# Patient Record
Sex: Female | Born: 2000 | Race: White | Hispanic: No | Marital: Single | Smoking: Never smoker
Health system: Southern US, Community
[De-identification: ages and names within clinical notes are randomized; demographics above are authoritative.]

## PROBLEM LIST (undated history)

## (undated) DIAGNOSIS — G919 Hydrocephalus, unspecified: Secondary | ICD-10-CM

## (undated) DIAGNOSIS — R569 Unspecified convulsions: Secondary | ICD-10-CM

## (undated) DIAGNOSIS — F419 Anxiety disorder, unspecified: Secondary | ICD-10-CM

## (undated) HISTORY — PX: CSF SHUNT: SHX92

## (undated) HISTORY — PX: BRAIN SURGERY: SHX531

## (undated) HISTORY — DX: Anxiety disorder, unspecified: F41.9

---
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
## 2021-12-01 ENCOUNTER — Emergency Department (HOSPITAL_COMMUNITY): Payer: Medicaid Other

## 2021-12-01 ENCOUNTER — Emergency Department (HOSPITAL_COMMUNITY)
Admission: EM | Admit: 2021-12-01 | Discharge: 2021-12-02 | Disposition: A | Discharge status: Home / Self Care | Payer: Medicaid Other | Attending: Emergency Medicine | Admitting: Emergency Medicine

## 2021-12-01 DIAGNOSIS — R569 Unspecified convulsions: Secondary | ICD-10-CM | POA: Diagnosis not present

## 2021-12-01 LAB — CBG MONITORING, ED: Glucose-Capillary: 148 mg/dL — ABNORMAL HIGH (ref 70–99)

## 2021-12-01 LAB — I-STAT BETA HCG BLOOD, ED (MC, WL, AP ONLY): I-stat hCG, quantitative: <5 m[IU]/mL (ref <5)

## 2021-12-01 MED ORDER — SODIUM CHLORIDE 0.9 % IV BOLUS
1000.0000 mL | Freq: Once | INTRAVENOUS | Status: AC
Start: 2021-12-01 — End: 2021-12-02
  Administered 2021-12-01: 1000 mL via INTRAVENOUS

## 2021-12-01 MED ORDER — SODIUM CHLORIDE 0.9 % IV SOLN: INTRAVENOUS | Status: DC

## 2021-12-01 MED ORDER — IBUPROFEN 400 MG PO TABS
600.0000 mg | ORAL_TABLET | Freq: Once | ORAL | Status: AC
  Administered 2021-12-01: 600 mg via ORAL
  Filled 2021-12-01: qty 1

## 2021-12-01 NOTE — ED Triage Notes (Signed)
Patient BIB by EMS for witnessed seizure . Patient has h/x of seizures and hydrocephalus . Patient has a shunt placement for condition and takes keppra daily for seizures. Airway is clear , respirations are even and non labored . Patient cannot remember her seizure , but denies falls.  ?

## 2021-12-01 NOTE — ED Provider Notes (Signed)
?[facility] ?Provider Note ? ? ?CSN: [id number] ?Arrival date & time: 12/01/21  2152 ? ?  ? ?History ? ?Chief Complaint  ?Patient presents with  ? Witnessed Seizure   ? ? ?Sonya Miller is a 21 y.o. female. ? ?Pt is a 21 yo with a pmhx of seizures and hydrocephalus.  Pt takes keppra 1000 mg bid.  The pt said she's been compliant with her meds.  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  She said she went to the bathroom and had a seizure while there.  She does not hurt anywhere other than her head.  She said her roommates heard her and found her in the bathroom.  Pt does not remember the seizure.  She did not bite her tongue.  No incontinence.  No f/c.  She said she has been sleeping. ? ? ?  ? ?Home Medications ?Prior to Admission medications   ?Not on File  ?   ? ?Allergies    ?Patient has no allergy information on record.   ? ?Review of Systems   ?Review of Systems  ?Neurological:  Positive for seizures.  ?All other systems reviewed and are negative. ? ?Physical Exam ?Updated Vital Signs ?BP 100/63   Pulse 74   Temp 98.2 ?F (36.8 ?C) (Oral)   Resp 19   Ht 5\' 2"  (1.575 m)   Wt 66.7 kg   SpO2 99%   BMI 26.89 kg/m?  ?Physical Exam ?Vitals and nursing note reviewed.  ?Constitutional:   ?   Appearance: Normal appearance.  ?HENT:  ?   Head: Normocephalic and atraumatic.  ?   Right Ear: External ear normal.  ?   Left Ear: External ear normal.  ?   Nose: Nose normal.  ?   Mouth/Throat:  ?   Mouth: Mucous membranes are moist.  ?   Pharynx: Oropharynx is clear.  ?Eyes:  ?   Conjunctiva/sclera: Conjunctivae normal.  ?   Pupils: Pupils are equal, round, and reactive to light.  ?Cardiovascular:  ?   Rate and Rhythm: Normal rate and regular rhythm.  ?   Pulses: Normal pulses.  ?   Heart sounds: Normal heart sounds.  ?Pulmonary:  ?   Effort: Pulmonary effort is normal.  ?   Breath sounds: Normal breath sounds.  ?Abdominal:  ?   General: Abdomen is flat.  Bowel sounds are normal.  ?   Palpations: Abdomen is soft.  ?Musculoskeletal:     ?   General: Normal range of motion.  ?   Cervical back: Normal range of motion and neck supple.  ?Skin: ?   General: Skin is warm.  ?   Capillary Refill: Capillary refill takes less than 2 seconds.  ?Neurological:  ?   General: No focal deficit present.  ?   Mental Status: She is alert and oriented to person, place, and time.  ?Psychiatric:     ?   Mood and Affect: Mood normal.     ?   Behavior: Behavior normal.     ?   Thought Content: Thought content normal.     ?   Judgment: Judgment normal.  ? ? ?ED Results / Procedures / Treatments   ?Labs ?(all labs ordered are listed, but only abnormal results are displayed) ?Labs Reviewed  ?CBG MONITORING, ED - Abnormal; Notable for the following components:  ?    Result Value  ? Glucose-Capillary 148 (*)   ?  All other components within normal limits  ?CBC WITH DIFFERENTIAL/PLATELET  ?URINALYSIS, ROUTINE W REFLEX MICROSCOPIC  ?CBC WITH DIFFERENTIAL/PLATELET  ?COMPREHENSIVE METABOLIC PANEL  ?MAGNESIUM  ?CBC WITH DIFFERENTIAL/PLATELET  ?I-STAT BETA HCG BLOOD, ED ([facility], [facility], [facility] ONLY)  ? ? ?EKG ?None ? ?Radiology ?CT Head Wo Contrast ? ?Result Date: 12/01/2021 ?CLINICAL DATA:  Sudden onset severe headache, seizure EXAM: CT HEAD WITHOUT CONTRAST TECHNIQUE: Contiguous axial images were obtained from the base of the skull through the vertex without intravenous contrast. RADIATION DOSE REDUCTION: This exam was performed according to the departmental dose-optimization program which includes automated exposure control, adjustment of the mA and/or kV according to patient size and/or use of iterative reconstruction technique. COMPARISON:  None. FINDINGS: Brain: Ventriculostomy catheter via a right parietooccipital approach, tip in the region of the left thalamus. There is complete decompression of the lateral ventricles. Congenital abnormalities within the bilateral parietooccipital regions. Partial agenesis  of the corpus callosum. No acute infarct or hemorrhage. Midline structures are otherwise unremarkable. No acute extra-axial fluid collections. No mass effect. Vascular: No hyperdense vessel or unexpected calcification. Skull: Normal. Negative for fracture or focal lesion. Sinuses/Orbits: No acute finding. Other: None. IMPRESSION: 1. 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 2. No acute intracranial process. 3. Ventriculostomy catheter via a right parietooccipital approach, with complete decompression of the ventricular system. Electronically Signed   By: Randa Ngo M.D.   On: 12/01/2021 22:50   ? ?Procedures ?Procedures  ? ? ?Medications Ordered in ED ?Medications  ?sodium chloride 0.9 % bolus 1,000 mL (1,000 mLs Intravenous New Bag/Given 12/01/21 2247)  ?  And  ?0.9 %  sodium chloride infusion (has no administration in time range)  ?ibuprofen (ADVIL) tablet 600 mg (600 mg Oral Given 12/01/21 2245)  ? ? ?ED Course/ Medical Decision Making/ A&P ?  ?                        ?Medical Decision Making ?Amount and/or Complexity of Data Reviewed ?Labs: ordered. ?Radiology: ordered. ? ?Risk ?Prescription drug management. ? ? ?This patient presents to the ED for concern of seizure, this involves an extensive number of treatment options, and is a complaint that carries with it a high risk of complications and morbidity.  The differential diagnosis includes shunt abn, electrolyte abn, uti ? ? ?Co morbidities that complicate the patient evaluation ? ?Seizure d/o, shunt ? ? ?Additional history obtained: ? ?Additional history obtained from epic chart review ?External records from outside source obtained and reviewed including EMS report ? ? ?Lab Tests: ? ?I Ordered, and personally interpreted labs.  The pertinent results include:  preg is nl ? ? ?Imaging Studies ordered: ? ?I ordered imaging studies including ct head  ?I independently visualized  and interpreted imaging which showed  ?  ?IMPRESSION:  ?1. Congenital abnormalities of the bilateral parietal and occipital  ?lobes, as well as partial agenesis of the corpus callosum.  ?2. No acute intracranial process.  ?3. Ventriculostomy catheter via a right parietooccipital approach,  ?with complete decompression of the ventricular system.  ? ?I agree with the radiologist interpretation ? ? ?Cardiac Monitoring: ? ?The patient was maintained on a cardiac monitor.  I personally viewed and interpreted the cardiac monitored which showed an underlying rhythm of: nsr ? ? ? ?Problem List / ED Course: ? ?Seizure:  labs pending at shift change.  Pt signed out to Dr. Tyrone Nine ? ? ? ?  Social Determinants of Health: ? ?In college ? ?  ? ? ? ? ? ? ? ?Final Clinical Impression(s) / ED Diagnoses ?Final diagnoses:  ?Seizure ([place])  ? ? ?Rx / DC Orders ?ED Discharge Orders   ? ? None  ? ?  ? ? ?  ?Isla Pence, MD ?12/01/21 2324 ? ?

## 2021-12-02 LAB — CBC WITH DIFFERENTIAL/PLATELET
Abs Immature Granulocytes: 0.04 10*3/uL (ref 0.00–0.07)
Basophils Absolute: 0 10*3/uL (ref 0.0–0.1)
Basophils Relative: 0 %
Eosinophils Absolute: 0 10*3/uL (ref 0.0–0.5)
Eosinophils Relative: 0 %
HCT: 38.3 % (ref 36.0–46.0)
Hemoglobin: 12.5 g/dL (ref 12.0–15.0)
Immature Granulocytes: 0 %
Lymphocytes Relative: 17 %
Lymphs Abs: 1.8 10*3/uL (ref 0.7–4.0)
MCH: 30.4 pg (ref 26.0–34.0)
MCHC: 32.6 g/dL (ref 30.0–36.0)
MCV: 93.2 fL (ref 80.0–100.0)
Monocytes Absolute: 0.7 10*3/uL (ref 0.1–1.0)
Monocytes Relative: 7 %
Neutro Abs: 7.9 10*3/uL — ABNORMAL HIGH (ref 1.7–7.7)
Neutrophils Relative %: 76 %
Platelets: 173 10*3/uL (ref 150–400)
RBC: 4.11 MIL/uL (ref 3.87–5.11)
RDW: 12.7 % (ref 11.5–15.5)
WBC: 10.5 10*3/uL (ref 4.0–10.5)
nRBC: 0 % (ref 0.0–0.2)

## 2021-12-02 LAB — COMPREHENSIVE METABOLIC PANEL
ALT: 53 U/L — ABNORMAL HIGH (ref 0–44)
AST: 36 U/L (ref 15–41)
Albumin: 3.7 g/dL (ref 3.5–5.0)
Alkaline Phosphatase: 55 U/L (ref 38–126)
Anion gap: 10 (ref 5–15)
BUN: 13 mg/dL (ref 6–20)
CO2: 19 mmol/L — ABNORMAL LOW (ref 22–32)
Calcium: 8.3 mg/dL — ABNORMAL LOW (ref 8.9–10.3)
Chloride: 107 mmol/L (ref 98–111)
Creatinine, Ser: 0.71 mg/dL (ref 0.44–1.00)
GFR, Estimated: >60 mL/min (ref >60)
Glucose, Bld: 132 mg/dL — ABNORMAL HIGH (ref 70–99)
Potassium: 3.6 mmol/L (ref 3.5–5.1)
Sodium: 136 mmol/L (ref 135–145)
Total Bilirubin: 0.3 mg/dL (ref 0.3–1.2)
Total Protein: 6.1 g/dL — ABNORMAL LOW (ref 6.5–8.1)

## 2021-12-02 LAB — MAGNESIUM: Magnesium: 1.9 mg/dL (ref 1.7–2.4)

## 2021-12-02 NOTE — Discharge Instructions (Signed)
There was no significant abnormality of your blood work.  Please call your neurologist and let them know that you had a breakthrough seizure and see if they want to change any dosages of your medications. ?

## 2022-09-30 DIAGNOSIS — G40109 Localization-related (focal) (partial) symptomatic epilepsy and epileptic syndromes with simple partial seizures, not intractable, without status epilepticus: Secondary | ICD-10-CM | POA: Diagnosis not present

## 2022-10-20 DIAGNOSIS — L700 Acne vulgaris: Secondary | ICD-10-CM | POA: Diagnosis not present

## 2022-10-20 DIAGNOSIS — Z111 Encounter for screening for respiratory tuberculosis: Secondary | ICD-10-CM | POA: Diagnosis not present

## 2022-10-20 DIAGNOSIS — Z982 Presence of cerebrospinal fluid drainage device: Secondary | ICD-10-CM | POA: Diagnosis not present

## 2022-10-20 DIAGNOSIS — G40909 Epilepsy, unspecified, not intractable, without status epilepticus: Secondary | ICD-10-CM | POA: Diagnosis not present

## 2022-10-20 DIAGNOSIS — F419 Anxiety disorder, unspecified: Secondary | ICD-10-CM | POA: Diagnosis not present

## 2022-10-20 DIAGNOSIS — Z1389 Encounter for screening for other disorder: Secondary | ICD-10-CM | POA: Diagnosis not present

## 2022-10-20 DIAGNOSIS — F900 Attention-deficit hyperactivity disorder, predominantly inattentive type: Secondary | ICD-10-CM | POA: Diagnosis not present

## 2023-02-27 DIAGNOSIS — G40909 Epilepsy, unspecified, not intractable, without status epilepticus: Secondary | ICD-10-CM | POA: Diagnosis not present

## 2023-02-28 DIAGNOSIS — G40909 Epilepsy, unspecified, not intractable, without status epilepticus: Secondary | ICD-10-CM | POA: Diagnosis not present

## 2023-03-20 DIAGNOSIS — L700 Acne vulgaris: Secondary | ICD-10-CM | POA: Diagnosis not present

## 2023-03-20 DIAGNOSIS — R5383 Other fatigue: Secondary | ICD-10-CM | POA: Diagnosis not present

## 2023-03-20 DIAGNOSIS — K5900 Constipation, unspecified: Secondary | ICD-10-CM | POA: Diagnosis not present

## 2023-03-20 DIAGNOSIS — F900 Attention-deficit hyperactivity disorder, predominantly inattentive type: Secondary | ICD-10-CM | POA: Diagnosis not present

## 2023-03-20 DIAGNOSIS — F419 Anxiety disorder, unspecified: Secondary | ICD-10-CM | POA: Diagnosis not present

## 2023-03-20 DIAGNOSIS — G40909 Epilepsy, unspecified, not intractable, without status epilepticus: Secondary | ICD-10-CM | POA: Diagnosis not present

## 2023-03-20 DIAGNOSIS — R42 Dizziness and giddiness: Secondary | ICD-10-CM | POA: Diagnosis not present

## 2023-03-20 DIAGNOSIS — N939 Abnormal uterine and vaginal bleeding, unspecified: Secondary | ICD-10-CM | POA: Diagnosis not present

## 2023-03-24 DIAGNOSIS — R5383 Other fatigue: Secondary | ICD-10-CM | POA: Diagnosis not present

## 2023-03-24 DIAGNOSIS — G40109 Localization-related (focal) (partial) symptomatic epilepsy and epileptic syndromes with simple partial seizures, not intractable, without status epilepticus: Secondary | ICD-10-CM | POA: Diagnosis not present

## 2023-05-28 ENCOUNTER — Emergency Department (HOSPITAL_COMMUNITY)
Admission: EM | Admit: 2023-05-28 | Discharge: 2023-05-28 | Disposition: A | Discharge status: Home / Self Care | Payer: Medicaid Other | Attending: Emergency Medicine | Admitting: Emergency Medicine

## 2023-05-28 ENCOUNTER — Emergency Department (HOSPITAL_COMMUNITY): Payer: Medicaid Other

## 2023-05-28 ENCOUNTER — Other Ambulatory Visit: Payer: Self-pay

## 2023-05-28 ENCOUNTER — Encounter (HOSPITAL_COMMUNITY): Payer: Self-pay | Admitting: Emergency Medicine

## 2023-05-28 DIAGNOSIS — S63502A Unspecified sprain of left wrist, initial encounter: Secondary | ICD-10-CM | POA: Diagnosis not present

## 2023-05-28 DIAGNOSIS — S43402A Unspecified sprain of left shoulder joint, initial encounter: Secondary | ICD-10-CM | POA: Diagnosis not present

## 2023-05-28 DIAGNOSIS — S6992XA Unspecified injury of left wrist, hand and finger(s), initial encounter: Secondary | ICD-10-CM | POA: Diagnosis not present

## 2023-05-28 DIAGNOSIS — X58XXXA Exposure to other specified factors, initial encounter: Secondary | ICD-10-CM | POA: Diagnosis not present

## 2023-05-28 DIAGNOSIS — S4992XA Unspecified injury of left shoulder and upper arm, initial encounter: Secondary | ICD-10-CM | POA: Diagnosis not present

## 2023-05-28 DIAGNOSIS — R531 Weakness: Secondary | ICD-10-CM | POA: Diagnosis not present

## 2023-05-28 DIAGNOSIS — S4982XA Other specified injuries of left shoulder and upper arm, initial encounter: Secondary | ICD-10-CM | POA: Diagnosis not present

## 2023-05-28 HISTORY — DX: Unspecified convulsions: R56.9

## 2023-05-28 NOTE — ED Provider Notes (Signed)
[place] EMERGENCY DEPARTMENT AT [facility] Provider Note   CSN: [id number] Arrival date & time: 05/28/23  1956     History  Chief Complaint  Patient presents with   Shoulder Pain    Sonya Miller is a 22 y.o. female.  Pt complains of L shoulder and wrist pain since last night. Sustained injury when her backpack got stuck on her watch and pulled her shoulder. Felt sudden sharp pain. Went to student health and was recommended to come for imaging. Feels the fingers on her left hand getting "cold".    Shoulder Pain      Home Medications Prior to Admission medications   Not on File      Allergies    Patient has no known allergies.    Review of Systems   Review of Systems  Musculoskeletal:  Positive for arthralgias.  All other systems reviewed and are negative.   Physical Exam Updated Vital Signs BP 106/72   Pulse 72   Temp 98 F (36.7 C) (Oral)   Resp 18   Ht 5\' 2"  (1.575 m)   Wt 61.2 kg   LMP 04/29/2023 (Exact Date)   SpO2 98%   BMI 24.69 kg/m  Physical Exam Vitals and nursing note reviewed.  Constitutional:      Appearance: Normal appearance.  HENT:     Head: Normocephalic and atraumatic.  Eyes:     Conjunctiva/sclera: Conjunctivae normal.  Pulmonary:     Effort: Pulmonary effort is normal. No respiratory distress.  Musculoskeletal:     Comments: No deformities palpated of the shoulder or wrist, normal ROM of joints of left UE with some pain, normal grip strength bilaterally  Skin:    General: Skin is warm and dry.  Neurological:     Mental Status: She is alert.     Comments: Normal sensation in BUE  Psychiatric:        Mood and Affect: Mood normal.        Behavior: Behavior normal.     ED Results / Procedures / Treatments   Labs (all labs ordered are listed, but only abnormal results are displayed) Labs Reviewed - No data to display  EKG None  Radiology DG Wrist Complete Left  Result Date: 05/28/2023 CLINICAL DATA:   injury EXAM: LEFT WRIST - COMPLETE 3+ VIEW COMPARISON:  None Available. FINDINGS: There is no evidence of fracture or dislocation. Cortical irregularity of the scaphoid waist suggestive of an old healed fracture. No definite acute scaphoid waist fracture. There is no evidence of severe arthropathy or other focal bone abnormality. Soft tissues are unremarkable. IMPRESSION: No acute displaced fracture or dislocation. Electronically Signed   By: Tish Frederickson M.D.   On: 05/28/2023 21:23   DG Shoulder Left  Result Date: 05/28/2023 CLINICAL DATA:  Injury backpack got stuck on her watch and pulled her shoulde EXAM: LEFT SHOULDER - 2+ VIEW COMPARISON:  None Available. FINDINGS: There is no evidence of fracture or dislocation. There is no evidence of arthropathy or other focal bone abnormality. Soft tissues are unremarkable. IMPRESSION: Negative. Electronically Signed   By: Tish Frederickson M.D.   On: 05/28/2023 21:13    Procedures Procedures    Medications Ordered in ED Medications - No data to display  ED Course/ Medical Decision Making/ A&P                                 Medical Decision  Making Amount and/or Complexity of Data Reviewed Radiology: ordered.   This patient is a 22 y.o. female  who presents to the ED for concern of L shoulder and L wrist pain after injury.   Differential diagnoses prior to evaluation: The emergent differential diagnosis includes, but is not limited to,  fracture, dislocation, ligamentous injury . This is not an exhaustive differential.   Past Medical History / Co-morbidities / Social History: Seizures on keppra, congenital hydrocephalus s/p VP shunt, mild developmental delay, migraines  Physical Exam: Physical exam performed. The pertinent findings include: Normal vital signs, no acute distress. Normal range of motion of the joints of the left upper extremity, with some pain.  No palpable deformities noted to the shoulder or wrist.  Normal grip strength  bilaterally.  Normal sensation.  Lab Tests/Imaging studies: I personally interpreted labs/imaging and the pertinent results include:  XR of the left shoulder and left wrist without acute abnormalities. I agree with the radiologist interpretation.  Disposition: After consideration of the diagnostic results and the patients response to treatment, I feel that emergency department workup does not suggest an emergent condition requiring admission or immediate intervention beyond what has been performed at this time. The plan is: discharge to home with symptomatic management of likely left shoulder sprain and left wrist sprain. Given shoulder sling, recommend RICE method and OTC meds. Given orthopedic follow up if symptoms persist. The patient is safe for discharge and has been instructed to return immediately for worsening symptoms, change in symptoms or any other concerns.  Final Clinical Impression(s) / ED Diagnoses Final diagnoses:  Sprain of left shoulder, unspecified shoulder sprain type, initial encounter  Sprain of left wrist, initial encounter    Rx / DC Orders ED Discharge Orders     None      Portions of this report may have been transcribed using voice recognition software. Every effort was made to ensure accuracy; however, inadvertent computerized transcription errors may be present.    Jeanella Flattery 05/28/23 2324    Lonell Grandchild, MD 06/02/23 [id number]

## 2023-05-28 NOTE — Discharge Instructions (Signed)
You are seen in the emergency department for shoulder and wrist pain.  As we discussed your x-rays did not show any broken or dislocated bones.  We have given you a different sling.    Please use acetaminophen (Tylenol) or ibuprofen (Advil, Motrin) for pain.  You may use 800 mg ibuprofen every 6 hours or 1000 mg of acetaminophen every 6 hours.  You may choose to alternate between the two, this would be most effective. Do not exceed 4000 mg of acetaminophen within 24 hours.  Do not exceed 3200 mg ibuprofen within 24 hours.  If you have been taking medication and wearing the sling appropriately and you have no improvement of your symptoms within a week, I recommend following with your orthopedist.  I have attached their contact information for you make an appointment.  Continue to monitor how you're doing and return to the ER for new or worsening symptoms.

## 2023-05-28 NOTE — ED Triage Notes (Signed)
Pt arriving via [facility] from [facility] for left shoulder pain. Pt reports she injured her shoulder last night, her backpack got stuck on her watch and pulled her shoulder. Pt states she didn't hear any popping but did feel a sudden sharp pain and says it felt like her "shoulder was on fire". Pt currently in sling. Student health on campus advised her to get an xray and/or MRI. Pt reports some numbness in her fingers and says they feel cold.

## 2023-05-28 NOTE — ED Provider Triage Note (Signed)
Emergency Medicine Provider Triage Evaluation Note  Sonya Miller , a 22 y.o. female  was evaluated in triage.  Pt complains of L shoulder and wrist pain since last night. Sustained injury. Went to student health and was recommended to come for imaging. Some intermittent numbness in fingers of the left hand.   Review of Systems  Positive: As above Negative:   Physical Exam  BP 106/72   Pulse 72   Temp 98 F (36.7 C) (Oral)   Resp 18   Ht 5\' 2"  (1.575 m)   Wt 76.7 kg   SpO2 98%   BMI 30.91 kg/m  Gen:   Awake, no distress   Resp:  Normal effort  MSK:   Moves extremities without difficulty  Other:  Normal sensation of BUE  Medical Decision Making  Medically screening exam initiated at 8:07 PM.  Appropriate orders placed.  Anyelina Broadfoot was informed that the remainder of the evaluation will be completed by another provider, this initial triage assessment does not replace that evaluation, and the importance of remaining in the ED until their evaluation is complete.  Imaging ordered   Su Monks, PA-C 05/28/23 2012

## 2023-06-21 DIAGNOSIS — G40909 Epilepsy, unspecified, not intractable, without status epilepticus: Secondary | ICD-10-CM | POA: Diagnosis not present

## 2023-06-21 DIAGNOSIS — Z79899 Other long term (current) drug therapy: Secondary | ICD-10-CM | POA: Diagnosis not present

## 2023-06-21 DIAGNOSIS — Q044 Septo-optic dysplasia of brain: Secondary | ICD-10-CM | POA: Diagnosis not present

## 2023-06-21 DIAGNOSIS — F909 Attention-deficit hyperactivity disorder, unspecified type: Secondary | ICD-10-CM | POA: Diagnosis not present

## 2023-06-21 DIAGNOSIS — Z760 Encounter for issue of repeat prescription: Secondary | ICD-10-CM | POA: Diagnosis not present

## 2023-07-12 ENCOUNTER — Other Ambulatory Visit: Payer: Self-pay

## 2023-07-12 ENCOUNTER — Emergency Department (HOSPITAL_COMMUNITY)
Admission: EM | Admit: 2023-07-12 | Discharge: 2023-07-12 | Disposition: A | Discharge status: Home / Self Care | Payer: Medicaid Other | Attending: Emergency Medicine | Admitting: Emergency Medicine

## 2023-07-12 DIAGNOSIS — R457 State of emotional shock and stress, unspecified: Secondary | ICD-10-CM | POA: Diagnosis not present

## 2023-07-12 DIAGNOSIS — G4489 Other headache syndrome: Secondary | ICD-10-CM | POA: Diagnosis not present

## 2023-07-12 DIAGNOSIS — G40909 Epilepsy, unspecified, not intractable, without status epilepticus: Secondary | ICD-10-CM | POA: Diagnosis not present

## 2023-07-12 DIAGNOSIS — R569 Unspecified convulsions: Secondary | ICD-10-CM | POA: Insufficient documentation

## 2023-07-12 LAB — RAPID URINE DRUG SCREEN, HOSP PERFORMED
Amphetamines: NOT DETECTED
Barbiturates: NOT DETECTED
Benzodiazepines: NOT DETECTED
Cocaine: NOT DETECTED
Opiates: NOT DETECTED
Tetrahydrocannabinol: NOT DETECTED

## 2023-07-12 LAB — COMPREHENSIVE METABOLIC PANEL
ALT: 24 U/L (ref 0–44)
AST: 20 U/L (ref 15–41)
Albumin: 4.4 g/dL (ref 3.5–5.0)
Alkaline Phosphatase: 62 U/L (ref 38–126)
Anion gap: 9 (ref 5–15)
BUN: 18 mg/dL (ref 6–20)
CO2: 24 mmol/L (ref 22–32)
Calcium: 9.5 mg/dL (ref 8.9–10.3)
Chloride: 102 mmol/L (ref 98–111)
Creatinine, Ser: 0.73 mg/dL (ref 0.44–1.00)
GFR, Estimated: >60 mL/min (ref >60)
Glucose, Bld: 106 mg/dL — ABNORMAL HIGH (ref 70–99)
Potassium: 3.9 mmol/L (ref 3.5–5.1)
Sodium: 135 mmol/L (ref 135–145)
Total Bilirubin: 0.6 mg/dL (ref 0.3–1.2)
Total Protein: 8 g/dL (ref 6.5–8.1)

## 2023-07-12 LAB — CBC WITH DIFFERENTIAL/PLATELET
Abs Immature Granulocytes: 0.17 10*3/uL — ABNORMAL HIGH (ref 0.00–0.07)
Basophils Absolute: 0 10*3/uL (ref 0.0–0.1)
Basophils Relative: 0 %
Eosinophils Absolute: 0 10*3/uL (ref 0.0–0.5)
Eosinophils Relative: 0 %
HCT: 43.6 % (ref 36.0–46.0)
Hemoglobin: 14.2 g/dL (ref 12.0–15.0)
Immature Granulocytes: 2 %
Lymphocytes Relative: 15 %
Lymphs Abs: 1.3 10*3/uL (ref 0.7–4.0)
MCH: 29.3 pg (ref 26.0–34.0)
MCHC: 32.6 g/dL (ref 30.0–36.0)
MCV: 89.9 fL (ref 80.0–100.0)
Monocytes Absolute: 0.5 10*3/uL (ref 0.1–1.0)
Monocytes Relative: 5 %
Neutro Abs: 7.1 10*3/uL (ref 1.7–7.7)
Neutrophils Relative %: 78 %
Platelets: 199 10*3/uL (ref 150–400)
RBC: 4.85 MIL/uL (ref 3.87–5.11)
RDW: 12.9 % (ref 11.5–15.5)
WBC: 9.1 10*3/uL (ref 4.0–10.5)
nRBC: 0 % (ref 0.0–0.2)

## 2023-07-12 LAB — URINALYSIS, ROUTINE W REFLEX MICROSCOPIC
Bilirubin Urine: NEGATIVE
Glucose, UA: NEGATIVE mg/dL
Hgb urine dipstick: NEGATIVE
Ketones, ur: 5 mg/dL — AB
Nitrite: NEGATIVE
Protein, ur: NEGATIVE mg/dL
Specific Gravity, Urine: 1.018 (ref 1.005–1.030)
pH: 7 (ref 5.0–8.0)

## 2023-07-12 LAB — HCG, QUANTITATIVE, PREGNANCY: hCG, Beta Chain, Quant, S: 1 m[IU]/mL (ref <5)

## 2023-07-12 LAB — ETHANOL: Alcohol, Ethyl (B): <10 mg/dL (ref <10)

## 2023-07-12 MED ORDER — LEVETIRACETAM IN NACL 1000 MG/100ML IV SOLN
1000.0000 mg | Freq: Once | INTRAVENOUS | Status: AC
  Administered 2023-07-12: 1000 mg via INTRAVENOUS
  Filled 2023-07-12: qty 100

## 2023-07-12 MED ORDER — ONDANSETRON HCL 4 MG/2ML IJ SOLN
4.0000 mg | Freq: Once | INTRAMUSCULAR | Status: AC
  Administered 2023-07-12: 4 mg via INTRAVENOUS
  Filled 2023-07-12: qty 2

## 2023-07-12 NOTE — ED Notes (Signed)
RN informed patient that UA was needed patient voiced she could not go at this time but will try to give sample later.

## 2023-07-12 NOTE — Discharge Instructions (Signed)
Follow-up with your neurologist in the next couple weeks.  Make sure you get enough sleep

## 2023-07-12 NOTE — ED Provider Notes (Signed)
[place] EMERGENCY DEPARTMENT AT [facility] Provider Note   CSN: [id number] Arrival date & time: 07/12/23  1555     History {Add pertinent medical, surgical, social history, OB history to HPI:1} Chief Complaint  Patient presents with   Seizures    Sonya Miller is a 22 y.o. female.  Patient had a seizure today.  She has a history of seizures.  She states that she did not get much sleep last night.   Seizures      Home Medications Prior to Admission medications   Medication Sig Start Date End Date Taking? Authorizing Provider  busPIRone (BUSPAR) 5 MG tablet Take 5 mg by mouth 2 (two) times daily.   Yes [provider]  levETIRAcetam (KEPPRA) 1000 MG tablet Take 1,000 mg by mouth 2 (two) times daily.   Yes [provider]  methylphenidate 27 MG PO CR tablet Take 27 mg by mouth daily. 06/22/23  Yes [provider]  minocycline (MINOCIN) 50 MG capsule Take 50 mg by mouth daily.   Yes [provider]      Allergies    Patient has no known allergies.    Review of Systems   Review of Systems  Neurological:  Positive for seizures.    Physical Exam Updated Vital Signs BP 96/62   Pulse 63   Temp 97.9 F (36.6 C) (Oral)   Resp 17   Ht 5\' 2"  (1.575 m)   Wt 65 kg   LMP 06/11/2023 (Approximate)   SpO2 100%   BMI 26.21 kg/m  Physical Exam  ED Results / Procedures / Treatments   Labs (all labs ordered are listed, but only abnormal results are displayed) Labs Reviewed  CBC WITH DIFFERENTIAL/PLATELET - Abnormal; Notable for the following components:      Result Value   Abs Immature Granulocytes 0.17 (*)    All other components within normal limits  COMPREHENSIVE METABOLIC PANEL - Abnormal; Notable for the following components:   Glucose, Bld 106 (*)    All other components within normal limits  URINALYSIS, ROUTINE W REFLEX MICROSCOPIC - Abnormal; Notable for the following components:   Ketones, ur 5 (*)     Leukocytes,Ua MODERATE (*)    Bacteria, UA RARE (*)    All other components within normal limits  URINE CULTURE  ETHANOL  HCG, QUANTITATIVE, PREGNANCY  RAPID URINE DRUG SCREEN, HOSP PERFORMED    EKG None  Radiology No results found.  Procedures Procedures  {Document cardiac monitor, telemetry assessment procedure when appropriate:1}  Medications Ordered in ED Medications  ondansetron (ZOFRAN) injection 4 mg (4 mg Intravenous Given 07/12/23 1634)  levETIRAcetam (KEPPRA) IVPB 1000 mg/100 mL premix (0 mg Intravenous Stopped 07/12/23 1711)    ED Course/ Medical Decision Making/ A&P   {   Click here for ABCD2, HEART and other calculatorsREFRESH Note before signing :1}                              Medical Decision Making Amount and/or Complexity of Data Reviewed Labs: ordered.  Risk Prescription drug management.   Patient with a seizure.  She will continue taking her Keppra.  She was given an additional 1 g.  She will contact her neurologist  {Document critical care time when appropriate:1} {Document review of labs and clinical decision tools ie heart score, Chads2Vasc2 etc:1}  {Document your independent review of radiology images, and any outside records:1} {Document your discussion with family  members, caretakers, and with consultants:1} {Document social determinants of health affecting pt's care:1} {Document your decision making why or why not admission, treatments were needed:1} Final Clinical Impression(s) / ED Diagnoses Final diagnoses:  Seizure ([facility])    Rx / DC Orders ED Discharge Orders     None

## 2023-07-12 NOTE — ED Triage Notes (Signed)
Patient brought in by EMS from [facility] following a seizure. She denies LOC or hitting head no thinners but currently c/o headaches 7/10. Takes Keppra BID. CBG:134 120/56 60 100% RA

## 2023-07-14 ENCOUNTER — Emergency Department (HOSPITAL_COMMUNITY): Payer: Medicaid Other

## 2023-07-14 ENCOUNTER — Other Ambulatory Visit: Payer: Self-pay

## 2023-07-14 ENCOUNTER — Emergency Department (HOSPITAL_COMMUNITY)
Admission: EM | Admit: 2023-07-14 | Discharge: 2023-07-14 | Disposition: A | Discharge status: Home / Self Care | Payer: Medicaid Other | Attending: Emergency Medicine | Admitting: Emergency Medicine

## 2023-07-14 ENCOUNTER — Encounter (HOSPITAL_COMMUNITY): Payer: Self-pay

## 2023-07-14 DIAGNOSIS — R519 Headache, unspecified: Secondary | ICD-10-CM | POA: Insufficient documentation

## 2023-07-14 DIAGNOSIS — R569 Unspecified convulsions: Secondary | ICD-10-CM | POA: Diagnosis not present

## 2023-07-14 DIAGNOSIS — Z79899 Other long term (current) drug therapy: Secondary | ICD-10-CM | POA: Insufficient documentation

## 2023-07-14 DIAGNOSIS — R42 Dizziness and giddiness: Secondary | ICD-10-CM | POA: Diagnosis not present

## 2023-07-14 DIAGNOSIS — I959 Hypotension, unspecified: Secondary | ICD-10-CM | POA: Diagnosis not present

## 2023-07-14 DIAGNOSIS — G4489 Other headache syndrome: Secondary | ICD-10-CM | POA: Diagnosis not present

## 2023-07-14 DIAGNOSIS — Z982 Presence of cerebrospinal fluid drainage device: Secondary | ICD-10-CM | POA: Diagnosis not present

## 2023-07-14 DIAGNOSIS — G40109 Localization-related (focal) (partial) symptomatic epilepsy and epileptic syndromes with simple partial seizures, not intractable, without status epilepticus: Secondary | ICD-10-CM | POA: Diagnosis not present

## 2023-07-14 LAB — CBC WITH DIFFERENTIAL/PLATELET
Abs Immature Granulocytes: 0.03 10*3/uL (ref 0.00–0.07)
Basophils Absolute: 0.1 10*3/uL (ref 0.0–0.1)
Basophils Relative: 1 %
Eosinophils Absolute: 0.1 10*3/uL (ref 0.0–0.5)
Eosinophils Relative: 1 %
HCT: 43.1 % (ref 36.0–46.0)
Hemoglobin: 13.7 g/dL (ref 12.0–15.0)
Immature Granulocytes: 0 %
Lymphocytes Relative: 35 %
Lymphs Abs: 2.7 10*3/uL (ref 0.7–4.0)
MCH: 29.3 pg (ref 26.0–34.0)
MCHC: 31.8 g/dL (ref 30.0–36.0)
MCV: 92.1 fL (ref 80.0–100.0)
Monocytes Absolute: 0.7 10*3/uL (ref 0.1–1.0)
Monocytes Relative: 9 %
Neutro Abs: 4.1 10*3/uL (ref 1.7–7.7)
Neutrophils Relative %: 54 %
Platelets: 213 10*3/uL (ref 150–400)
RBC: 4.68 MIL/uL (ref 3.87–5.11)
RDW: 13.3 % (ref 11.5–15.5)
WBC: 7.7 10*3/uL (ref 4.0–10.5)
nRBC: 0 % (ref 0.0–0.2)

## 2023-07-14 LAB — COMPREHENSIVE METABOLIC PANEL
ALT: 22 U/L (ref 0–44)
AST: 18 U/L (ref 15–41)
Albumin: 4.5 g/dL (ref 3.5–5.0)
Alkaline Phosphatase: 68 U/L (ref 38–126)
Anion gap: 8 (ref 5–15)
BUN: 16 mg/dL (ref 6–20)
CO2: 24 mmol/L (ref 22–32)
Calcium: 9 mg/dL (ref 8.9–10.3)
Chloride: 107 mmol/L (ref 98–111)
Creatinine, Ser: 0.76 mg/dL (ref 0.44–1.00)
GFR, Estimated: >60 mL/min (ref >60)
Glucose, Bld: 98 mg/dL (ref 70–99)
Potassium: 3.8 mmol/L (ref 3.5–5.1)
Sodium: 139 mmol/L (ref 135–145)
Total Bilirubin: 0.4 mg/dL (ref <1.2)
Total Protein: 7.7 g/dL (ref 6.5–8.1)

## 2023-07-14 LAB — URINE CULTURE

## 2023-07-14 LAB — HCG, SERUM, QUALITATIVE: Preg, Serum: NEGATIVE

## 2023-07-14 MED ORDER — METOCLOPRAMIDE HCL 5 MG/ML IJ SOLN
10.0000 mg | Freq: Once | INTRAMUSCULAR | Status: AC
  Administered 2023-07-14: 10 mg via INTRAVENOUS
  Filled 2023-07-14: qty 2

## 2023-07-14 MED ORDER — LAMOTRIGINE 25 MG PO TABS
25.0000 mg | ORAL_TABLET | Freq: Once | ORAL | Status: AC
  Administered 2023-07-14: 25 mg via ORAL
  Filled 2023-07-14: qty 1

## 2023-07-14 MED ORDER — LEVETIRACETAM IN NACL 1000 MG/100ML IV SOLN
1000.0000 mg | Freq: Once | INTRAVENOUS | Status: AC
  Administered 2023-07-14: 1000 mg via INTRAVENOUS
  Filled 2023-07-14: qty 100

## 2023-07-14 MED ORDER — BUSPIRONE HCL 5 MG PO TABS: 5.0000 mg | ORAL_TABLET | Freq: Every day | ORAL | 0 refills | Status: DC

## 2023-07-14 MED ORDER — LACTATED RINGERS IV BOLUS
1000.0000 mL | Freq: Once | INTRAVENOUS | Status: AC
  Administered 2023-07-14: 1000 mL via INTRAVENOUS

## 2023-07-14 MED ORDER — LAMOTRIGINE 25 MG PO TABS: 25.0000 mg | ORAL_TABLET | Freq: Every day | ORAL | 0 refills | Status: DC

## 2023-07-14 NOTE — ED Triage Notes (Addendum)
BIBA from home- had a seizure yesterday, feels like she is going to have another one. Pt c/o headache, takes Keppra, has not missed any doses. Pt is A&O x 4, VSS.

## 2023-07-14 NOTE — Discharge Instructions (Signed)
Your lab work and CT scan today were reassuring.  You should start the Lamictal medication and your neurologist prescribed and continue taking your Keppra.  You should call your neurologist and primary care doctor tomorrow.  If you develop any new or concerning symptoms including repeat seizure, severe headache or any other concerns you should return to the ED.

## 2023-07-14 NOTE — ED Provider Notes (Signed)
  [place] EMERGENCY DEPARTMENT AT [facility] Provider Note   CSN: [id number] Arrival date & time: 07/14/23  1902     History {Add pertinent medical, surgical, social history, OB history to HPI:1} Chief Complaint  Patient presents with   Seizures    Sonya Miller is a 22 y.o. female.   Seizures      Home Medications Prior to Admission medications   Medication Sig Start Date End Date Taking? Authorizing Provider  busPIRone (BUSPAR) 5 MG tablet Take 5 mg by mouth 2 (two) times daily.    [provider]  levETIRAcetam (KEPPRA) 1000 MG tablet Take 1,000 mg by mouth 2 (two) times daily.    [provider]  methylphenidate 27 MG PO CR tablet Take 27 mg by mouth daily. 06/22/23   [provider]  minocycline (MINOCIN) 50 MG capsule Take 50 mg by mouth daily.    [provider]      Allergies    Patient has no known allergies.    Review of Systems   Review of Systems  Neurological:  Positive for seizures.    Physical Exam Updated Vital Signs BP 98/70   Pulse 68   Temp 97.9 F (36.6 C) (Oral)   Resp (!) 22   LMP 06/11/2023 (Approximate)   SpO2 100%  Physical Exam  ED Results / Procedures / Treatments   Labs (all labs ordered are listed, but only abnormal results are displayed) Labs Reviewed - No data to display  EKG None  Radiology No results found.  Procedures Procedures  {Document cardiac monitor, telemetry assessment procedure when appropriate:1}  Medications Ordered in ED Medications - No data to display  ED Course/ Medical Decision Making/ A&P   {   Click here for ABCD2, HEART and other calculatorsREFRESH Note before signing :1}                              Medical Decision Making  ***  {Document critical care time when appropriate:1} {Document review of labs and clinical decision tools ie heart score, Chads2Vasc2 etc:1}  {Document your independent review of radiology images, and any outside  records:1} {Document your discussion with family members, caretakers, and with consultants:1} {Document social determinants of health affecting pt's care:1} {Document your decision making why or why not admission, treatments were needed:1} Final Clinical Impression(s) / ED Diagnoses Final diagnoses:  None    Rx / DC Orders ED Discharge Orders     None

## 2023-08-04 ENCOUNTER — Emergency Department (HOSPITAL_COMMUNITY): Payer: Medicaid Other

## 2023-08-04 ENCOUNTER — Encounter (HOSPITAL_COMMUNITY): Payer: Self-pay

## 2023-08-04 ENCOUNTER — Other Ambulatory Visit: Payer: Self-pay

## 2023-08-04 ENCOUNTER — Emergency Department (HOSPITAL_COMMUNITY)
Admission: EM | Admit: 2023-08-04 | Discharge: 2023-08-04 | Disposition: A | Discharge status: Home / Self Care | Payer: Medicaid Other | Attending: Emergency Medicine | Admitting: Emergency Medicine

## 2023-08-04 DIAGNOSIS — G43909 Migraine, unspecified, not intractable, without status migrainosus: Secondary | ICD-10-CM | POA: Diagnosis not present

## 2023-08-04 DIAGNOSIS — R0989 Other specified symptoms and signs involving the circulatory and respiratory systems: Secondary | ICD-10-CM | POA: Diagnosis not present

## 2023-08-04 DIAGNOSIS — Z982 Presence of cerebrospinal fluid drainage device: Secondary | ICD-10-CM | POA: Diagnosis not present

## 2023-08-04 DIAGNOSIS — R519 Headache, unspecified: Secondary | ICD-10-CM | POA: Diagnosis not present

## 2023-08-04 DIAGNOSIS — R42 Dizziness and giddiness: Secondary | ICD-10-CM | POA: Diagnosis not present

## 2023-08-04 HISTORY — DX: Hydrocephalus, unspecified: G91.9

## 2023-08-04 LAB — COMPREHENSIVE METABOLIC PANEL
ALT: 25 U/L (ref 0–44)
AST: 20 U/L (ref 15–41)
Albumin: 4.5 g/dL (ref 3.5–5.0)
Alkaline Phosphatase: 67 U/L (ref 38–126)
Anion gap: 10 (ref 5–15)
BUN: 17 mg/dL (ref 6–20)
CO2: 25 mmol/L (ref 22–32)
Calcium: 9.6 mg/dL (ref 8.9–10.3)
Chloride: 105 mmol/L (ref 98–111)
Creatinine, Ser: 0.93 mg/dL (ref 0.44–1.00)
GFR, Estimated: >60 mL/min (ref >60)
Glucose, Bld: 102 mg/dL — ABNORMAL HIGH (ref 70–99)
Potassium: 3.6 mmol/L (ref 3.5–5.1)
Sodium: 140 mmol/L (ref 135–145)
Total Bilirubin: 0.5 mg/dL (ref <1.2)
Total Protein: 7.9 g/dL (ref 6.5–8.1)

## 2023-08-04 LAB — CBC WITH DIFFERENTIAL/PLATELET
Abs Immature Granulocytes: 0.03 10*3/uL (ref 0.00–0.07)
Basophils Absolute: 0 10*3/uL (ref 0.0–0.1)
Basophils Relative: 1 %
Eosinophils Absolute: 0.1 10*3/uL (ref 0.0–0.5)
Eosinophils Relative: 1 %
HCT: 43.8 % (ref 36.0–46.0)
Hemoglobin: 14.5 g/dL (ref 12.0–15.0)
Immature Granulocytes: 1 %
Lymphocytes Relative: 29 %
Lymphs Abs: 1.8 10*3/uL (ref 0.7–4.0)
MCH: 29.4 pg (ref 26.0–34.0)
MCHC: 33.1 g/dL (ref 30.0–36.0)
MCV: 88.8 fL (ref 80.0–100.0)
Monocytes Absolute: 0.4 10*3/uL (ref 0.1–1.0)
Monocytes Relative: 6 %
Neutro Abs: 4 10*3/uL (ref 1.7–7.7)
Neutrophils Relative %: 62 %
Platelets: 204 10*3/uL (ref 150–400)
RBC: 4.93 MIL/uL (ref 3.87–5.11)
RDW: 13.2 % (ref 11.5–15.5)
WBC: 6.3 10*3/uL (ref 4.0–10.5)
nRBC: 0 % (ref 0.0–0.2)

## 2023-08-04 LAB — HCG, SERUM, QUALITATIVE: Preg, Serum: NEGATIVE

## 2023-08-04 MED ORDER — KETOROLAC TROMETHAMINE 15 MG/ML IJ SOLN
15.0000 mg | Freq: Once | INTRAMUSCULAR | Status: AC
  Administered 2023-08-04: 15 mg via INTRAVENOUS
  Filled 2023-08-04: qty 1

## 2023-08-04 MED ORDER — DIPHENHYDRAMINE HCL 50 MG/ML IJ SOLN
25.0000 mg | Freq: Once | INTRAMUSCULAR | Status: AC
  Administered 2023-08-04: 25 mg via INTRAVENOUS
  Filled 2023-08-04: qty 1

## 2023-08-04 MED ORDER — METOCLOPRAMIDE HCL 5 MG/ML IJ SOLN
10.0000 mg | Freq: Once | INTRAMUSCULAR | Status: AC
  Administered 2023-08-04: 10 mg via INTRAVENOUS
  Filled 2023-08-04: qty 2

## 2023-08-04 NOTE — Discharge Instructions (Addendum)
Your scans and x-rays did not show any problems with the VP shunt currently.  However you should follow-up closely with your neurology specialist at [facility].  If you develop new or worsening eye symptoms, headache, vomiting, fever, or any other new/concerning symptoms then return to the ER or call 911.

## 2023-08-04 NOTE — ED Provider Notes (Signed)
[place] EMERGENCY DEPARTMENT AT [facility] Provider Note   CSN: [id number] Arrival date & time: 08/04/23  1054     History  Chief Complaint  Patient presents with   Eye Pain    Sonya Miller is a 22 y.o. female.  HPI 22 year old female presents for evaluation of headaches and eye pain.  For the past month or so she has been having more migraines than typical as well as right eye pain.  Usually she will have minimal to no discomfort in the morning that then gets worst by noon.  She has eye pressure and pain behind her eye.  This is typical of her migraines though it is more severe and more often.  No fevers or neck stiffness.  No focal weakness or numbness.  A couple weeks ago she went to an eye doctor who told her that her veins were swollen.  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  She currently has no vision complaints and her headache is about a 5 out of 10.  She was told by her neurologist to come to the ER to evaluate her shunt.  Home Medications Prior to Admission medications   Medication Sig Start Date End Date Taking? Authorizing Provider  busPIRone (BUSPAR) 5 MG tablet Take 1 tablet (5 mg total) by mouth daily. 07/14/23   Laurence Spates, MD  lamoTRIgine (LAMICTAL) 25 MG tablet Take 1 tablet (25 mg total) by mouth daily. Start lamotrigine 25mg  Wk 1 & 2: 1 tab daily Wk 3 & 4: 1 tab twice a day Wk 5: 1 tab in am 2 tabs in pm Wk 6: 2 tabs twice a day Wk 7: 2 tabs in am and 3 tabs in pm Wk 8: 3 tabs twice a day Wk 9: 3 tabs in am and 4 tabs in pm Wk 10 on: take 4 tabs twice a day 07/14/23   Laurence Spates, MD  levETIRAcetam (KEPPRA) 1000 MG tablet Take 1,000 mg by mouth 2 (two) times daily.    [provider]  methylphenidate 27 MG PO CR tablet Take 27 mg by mouth daily. 06/22/23   [provider]  minocycline (MINOCIN) 50 MG capsule Take 50 mg by mouth daily.    [provider]      Allergies    Patient has no  known allergies.    Review of Systems   Review of Systems  Constitutional:  Negative for fever.  Eyes:  Positive for photophobia, pain and visual disturbance.  Gastrointestinal:  Negative for vomiting.  Musculoskeletal:  Negative for neck pain.  Neurological:  Positive for headaches. Negative for weakness and numbness.    Physical Exam Updated Vital Signs BP 102/68 (BP Location: Left Arm)   Pulse 91   Temp (!) 97 F (36.1 C) (Temporal)   Resp 14   Ht 5\' 2"  (1.575 m)   Wt 65 kg   LMP 06/11/2023 (Approximate)   SpO2 100%   BMI 26.21 kg/m  Physical Exam Vitals and nursing note reviewed.  Constitutional:      Appearance: She is well-developed.  HENT:     Head: Normocephalic and atraumatic.     Comments: No tenderness along right sided shunt Eyes:     Extraocular Movements: Extraocular movements intact.     Pupils: Pupils are equal, round, and reactive to light.     Comments: + photophobia in right eye  Cardiovascular:     Rate and Rhythm:  Normal rate and regular rhythm.     Heart sounds: Normal heart sounds.  Pulmonary:     Effort: Pulmonary effort is normal.     Breath sounds: Normal breath sounds.  Abdominal:     Palpations: Abdomen is soft.     Tenderness: There is no abdominal tenderness.  Musculoskeletal:     Cervical back: Normal range of motion. No rigidity.  Skin:    General: Skin is warm and dry.  Neurological:     Mental Status: She is alert.     Comments: CN 3-12 grossly intact. 5/5 strength in all 4 extremities. Grossly normal sensation. Normal finger to nose.      ED Results / Procedures / Treatments   Labs (all labs ordered are listed, but only abnormal results are displayed) Labs Reviewed  COMPREHENSIVE METABOLIC PANEL - Abnormal; Notable for the following components:      Result Value   Glucose, Bld 102 (*)    All other components within normal limits  HCG, SERUM, QUALITATIVE  CBC WITH DIFFERENTIAL/PLATELET    EKG None  Radiology DG  Abd 1 View  Result Date: 08/04/2023 CLINICAL DATA:  Obstructed VP shunt, initial encounter.  Headache. EXAM: ABDOMEN - 1 VIEW COMPARISON:  None Available. FINDINGS: VP shunt tubing courses through the right abdomen and is looped in the right lower quadrant without evidence of discontinuity. No dilated loops of bowel are seen to suggest obstruction. No acute osseous abnormality is evident. IMPRESSION: Intact VP shunt tubing in the abdomen. Electronically Signed   By: Sebastian Ache M.D.   On: 08/04/2023 14:43   DG Chest 1 View  Result Date: 08/04/2023 CLINICAL DATA:  Obstructed VP shunt, initial encounter.  Headache. EXAM: CHEST  1 VIEW COMPARISON:  None Available. FINDINGS: VP shunt tubing courses through the right chest into the abdomen without evidence of discontinuity on this radiograph. Lung volumes are low. No airspace consolidation, edema, pleural effusion, or pneumothorax is identified. The cardiomediastinal silhouette is within normal limits. No acute osseous abnormality is seen. IMPRESSION: Intact VP shunt tubing in the chest. Electronically Signed   By: Sebastian Ache M.D.   On: 08/04/2023 14:42   DG Skull 1-3 Views  Result Date: 08/04/2023 CLINICAL DATA:  Obstructed VP shunt, initial encounter.  Headache. EXAM: SKULL - 1-3 VIEW COMPARISON:  Head CT 08/04/2023 FINDINGS: A right parieto-occipital approach ventriculostomy catheter is in place and terminates slightly left of midline as shown on today's head CT. The tubing courses into the right neck without evidence of discontinuity IMPRESSION: Intact VP shunt tubing in the head and upper neck. Electronically Signed   By: Sebastian Ache M.D.   On: 08/04/2023 14:41   DG Cervical Spine 1 View  Result Date: 08/04/2023 CLINICAL DATA:  Obstructed VP shunt, initial encounter.  Headache. EXAM: DG CERVICAL SPINE - 1 VIEW COMPARISON:  None Available. FINDINGS: VP shunt tubing courses through the right neck into the chest. There is mild scattered  calcification along the tubing without evidence of discontinuity. IMPRESSION: Intact VP shunt tubing in the neck. Electronically Signed   By: Sebastian Ache M.D.   On: 08/04/2023 14:34   CT Head Wo Contrast  Result Date: 08/04/2023 CLINICAL DATA:  Intracranial shunt eval Headache, increasing frequency or severity EXAM: CT HEAD WITHOUT CONTRAST TECHNIQUE: Contiguous axial images were obtained from the base of the skull through the vertex without intravenous contrast. RADIATION DOSE REDUCTION: This exam was performed according to the departmental dose-optimization program which includes automated exposure control,  adjustment of the mA and/or kV according to patient size and/or use of iterative reconstruction technique. COMPARISON:  CT head July 14, 2023. FINDINGS: Brain: Similar position of a right parietal approach ventriculostomy catheter with the tip just left of midline. The ventricles are decompressed. 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 No evidence of acute large vascular territory infarct, acute hemorrhage, mass lesion or significant midline shift. Vascular: No hyperdense vessel. Skull: No acute fracture. Sinuses/Orbits: Clear sinuses.  No acute orbital findings. Other: No mastoid effusions. IMPRESSION: 1. No evidence of acute intracranial abnormality. 2. Similar position of shunt catheter with similar decompressed ventricles. 3. 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 Electronically Signed   By: Feliberto Harts M.D.   On: 08/04/2023 12:59    Procedures Procedures    Medications Ordered in ED Medications  metoCLOPramide (REGLAN) injection 10 mg (has no administration in time range)  diphenhydrAMINE (BENADRYL) injection 25 mg (has no administration in time range)  ketorolac (TORADOL) 15 MG/ML injection 15 mg (has no administration in time range)    ED Course/ Medical Decision Making/  A&P                                 Medical Decision Making Amount and/or Complexity of Data Reviewed Labs: ordered.    Details: Normal WBC, unremarkable electrolytes. Radiology: ordered and independent interpretation performed.    Details: No ventriculomegaly  Risk Prescription drug management.   Patient seems to be having more frequent migraines.  These are similar to her other migraines.  She has no actual visual deficit currently and her eye externally appears normal.  When she went to the eye specialist a couple weeks ago it did not seem like they found an emergent condition.  Her CT does not show any evidence of hydrocephalus.  Her symptoms have resolved after treatment with migraine cocktail.  Discussed that I think she will need to follow-up with her neurologist at [facility] but I do not see an emergent condition at this time.  I doubt an ocular emergency.  Will discharge home with return precautions.        Final Clinical Impression(s) / ED Diagnoses Final diagnoses:  Migraine without status migrainosus, not intractable, unspecified migraine type    Rx / DC Orders ED Discharge Orders     None         Pricilla Loveless, MD 08/04/23 1556

## 2023-08-04 NOTE — ED Triage Notes (Signed)
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  Pt states she started having pressure behind her right eye approximately a month ago. Pt went to eye doctor approximately 2 weeks ago and states they told her the veins behind her eyes were swollen. Pt states her neurologist asked her to go to ED to have her VP shunt evaluated.  Pt states she has lost vision in her left eye d/t hydrocephalus previously. Pt c/o increasing pain in right eye over past month.

## 2023-08-24 DIAGNOSIS — F419 Anxiety disorder, unspecified: Secondary | ICD-10-CM | POA: Diagnosis not present

## 2023-08-24 DIAGNOSIS — G43909 Migraine, unspecified, not intractable, without status migrainosus: Secondary | ICD-10-CM | POA: Diagnosis not present

## 2023-08-24 DIAGNOSIS — F900 Attention-deficit hyperactivity disorder, predominantly inattentive type: Secondary | ICD-10-CM | POA: Diagnosis not present

## 2023-08-24 DIAGNOSIS — L700 Acne vulgaris: Secondary | ICD-10-CM | POA: Diagnosis not present

## 2023-08-24 DIAGNOSIS — N939 Abnormal uterine and vaginal bleeding, unspecified: Secondary | ICD-10-CM | POA: Diagnosis not present

## 2023-08-24 DIAGNOSIS — G40909 Epilepsy, unspecified, not intractable, without status epilepticus: Secondary | ICD-10-CM | POA: Diagnosis not present

## 2023-08-25 DIAGNOSIS — G40109 Localization-related (focal) (partial) symptomatic epilepsy and epileptic syndromes with simple partial seizures, not intractable, without status epilepticus: Secondary | ICD-10-CM | POA: Diagnosis not present

## 2023-11-05 ENCOUNTER — Ambulatory Visit: Payer: Self-pay

## 2023-11-05 NOTE — Telephone Encounter (Signed)
 Copied from CRM [id number]. Topic: Clinical - Red Word Triage >> Nov 05, 2023  3:32 PM Tiffany H wrote: Patient called to advise that she's been having sharp stomach pains for the past couple of weeks. Patient was last seen with [facility] today. Stomach pain level was a ten. Patient was told to find PCP immediately. Please assist.   Availability: Thursdays after 10AM. Anytime Monday, Wednesday Friday after 1AM.   Chief Complaint: Abdominal Pain Symptoms: Back Pain,  Frequency: One Month Pertinent Negatives: Patient denies nausea, vomiting Disposition: [] ED /[] Urgent Care (no appt availability in office) / [] Appointment(In office/virtual)/ []  [facility]/ [] Home Care/ [x] Refused Recommended Disposition /[] [facility]/ []  Follow-up with PCP Additional Notes: JB is being triaged for abdominal pain that radiates to her back. The patient is not established in [facility] and would like to make a new patient appointment as well. Discussed symptom severity, duration, and recommended the patient be seen by a provider within the next 24 hours. The patient verbalized she'd [facility] earlier and did not want to be seen by another provider. Provided education to the patient regarding when to seek care, patient verbalized understanding. New patient appointment made with location of choice for patient.   Reason for Disposition  [1] MODERATE pain (e.g., interferes with normal activities) AND [2] pain comes and goes (cramps) AND [3] present > 24 hours  (Exception: Pain with Vomiting or Diarrhea - see that Guideline.)  Answer Assessment - Initial Assessment Questions 1. LOCATION: "Where does it hurt?"      Lower Abdomen 2. RADIATION: "Does the pain shoot anywhere else?" (e.g., chest, back)     Back  3. ONSET: "When did the pain begin?" (e.g., minutes, hours or days ago)      A month ago  4. SUDDEN: "Gradual or sudden onset?"      Gradual  5. PATTERN "Does the pain come and go, or is it constant?"    - If it comes and goes: "How long does it last?" "Do you have pain now?"     (Note: Comes and goes means the pain is intermittent. It goes away completely between bouts.)    - If constant: "Is it getting better, staying the same, or getting worse?"      (Note: Constant means the pain never goes away completely; most serious pain is constant and gets worse.)      Constant, positional  6. SEVERITY: "How bad is the pain?"  (e.g., Scale 1-10; mild, moderate, or severe)    - MILD (1-3): Doesn't interfere with normal activities, abdomen soft and not tender to touch.     - MODERATE (4-7): Interferes with normal activities or awakens from sleep, abdomen tender to touch.     - SEVERE (8-10): Excruciating pain, doubled over, unable to do any normal activities.       8  7. RECURRENT SYMPTOM: "Have you ever had this type of stomach pain before?" If Yes, ask: "When was the last time?" and "What happened that time?"      Yes, Constipation  8. CAUSE: "What do you think is causing the stomach pain?"     Unsure  9. RELIEVING/AGGRAVATING FACTORS: "What makes it better or worse?" (e.g., antacids, bending or twisting motion, bowel movement)     Fetal Position provides relief  10. OTHER SYMPTOMS: "Do you have any other symptoms?" (e.g., back pain, diarrhea, fever, urination pain, vomiting)  Constipation, Diarrhea  11. PREGNANCY: "Is there any chance you are pregnant?" "When was your last menstrual period?"       No, Irregular, 09-23-2023  Protocols used: Abdominal Pain - [facility]

## 2023-11-06 NOTE — Telephone Encounter (Signed)
 LVM for patient to return call.

## 2023-11-08 DIAGNOSIS — H5213 Myopia, bilateral: Secondary | ICD-10-CM | POA: Diagnosis not present

## 2023-11-10 NOTE — Telephone Encounter (Signed)
 I called and spoke with patient and she said that her abdominal pain comes and goes. Per Salvatore Decent, NP she is aware of patient's concern and said If symptoms worsen or fail to improve before new patient appointment, she will need to go to the ER. Patient aware.

## 2023-12-03 ENCOUNTER — Encounter: Payer: Self-pay | Admitting: Internal Medicine

## 2023-12-03 ENCOUNTER — Ambulatory Visit: Payer: Medicaid Other | Admitting: Internal Medicine

## 2023-12-03 VITALS — BP 102/64 | HR 76 | Temp 98.2°F | Ht 62.0 in | Wt 178.4 lb

## 2023-12-03 DIAGNOSIS — G40209 Localization-related (focal) (partial) symptomatic epilepsy and epileptic syndromes with complex partial seizures, not intractable, without status epilepticus: Secondary | ICD-10-CM

## 2023-12-03 DIAGNOSIS — L709 Acne, unspecified: Secondary | ICD-10-CM | POA: Diagnosis not present

## 2023-12-03 DIAGNOSIS — F411 Generalized anxiety disorder: Secondary | ICD-10-CM | POA: Diagnosis not present

## 2023-12-03 DIAGNOSIS — M545 Low back pain, unspecified: Secondary | ICD-10-CM

## 2023-12-03 DIAGNOSIS — J020 Streptococcal pharyngitis: Secondary | ICD-10-CM

## 2023-12-03 DIAGNOSIS — F900 Attention-deficit hyperactivity disorder, predominantly inattentive type: Secondary | ICD-10-CM | POA: Diagnosis not present

## 2023-12-03 DIAGNOSIS — G8929 Other chronic pain: Secondary | ICD-10-CM

## 2023-12-03 DIAGNOSIS — H5710 Ocular pain, unspecified eye: Secondary | ICD-10-CM

## 2023-12-03 LAB — POCT RAPID STREP A (OFFICE): Rapid Strep A Screen: POSITIVE — AB

## 2023-12-03 MED ORDER — PENICILLIN V POTASSIUM 500 MG PO TABS: 500.0000 mg | ORAL_TABLET | Freq: Two times a day (BID) | ORAL | 0 refills | Status: AC

## 2023-12-03 NOTE — Patient Instructions (Signed)
 Do NOT take minocycline while taking penicillin  Take Penicillin AFTER eating to avoid upset stomach  Change toothbrush 2 days after starting antibiotic

## 2023-12-03 NOTE — Progress Notes (Addendum)
 [facility] [address] [place] [address] Dept: [phone] Dept Fax: [phone]  New Patient Office Visit  Subjective:   Sonya Miller November 14, 2000 12/03/2023  Chief Complaint  Patient presents with   Establish Care   Sore Throat    Started 2 weeks ago and nausea     HPI: Sonya Miller presents today to establish care at [employer] [facility]. Introduced to Publishing rights manager role and practice setting.  All questions answered.  Concerns: See below   Discussed the use of AI scribe software for clinical note transcription with the patient, who gave verbal consent to proceed.  History of Present Illness   The patient, with a history of seizures, migraines, anxiety, ADHD, and acne, presents to establish care with a new primary care provider. She reports adherence to her current medication regimen, which includes Keppra 1000mg  twice daily for seizures, Buspar 5mg  daily for anxiety, methylphenidate 27mg  daily for ADHD, and minocycline for acne.  She has been seeing a neuro-ophthalmologist for eye pain that she has been recently experiencing.Was told it could be related to the glasses she purchased offline, her minocycline that she takes for acne, or her shunt.  She is scheduled to undergo an MRI, and is awaiting her new prescription glasses to see if this alleviates the pain.  The patient has been experiencing a sore throat for the past two weeks, which she describes as a feeling of constant mucus build-up and a need to spit. She reports no fever or lightheadedness. The patient has a history of tonsillitis, but she does not see any white patches in her throat currently. She has been using throat lozenges and a throat spray for relief.  In addition to the sore throat, the patient reports lower back pain, which she attributes to carrying a heavy backpack for school. She has been using a heating pad for relief. The  patient also mentions that she has been experiencing stress and has been seeing a counselor on campus. She reports that her eating habits have been irregular due to stress, with some days of not eating at all and other days of overeating.       The following portions of the patient's history were reviewed and updated as appropriate: past medical history, past surgical history, family history, social history, allergies, medications, and problem list.   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 No pertinent family history.  Current Outpatient Medications:    busPIRone (BUSPAR) 5 MG tablet, Take 1 tablet (5 mg total) by mouth daily., Disp: 30 tablet, Rfl: 0   levETIRAcetam (KEPPRA) 1000 MG tablet, Take 1,000 mg by mouth 2 (two) times daily., Disp: , Rfl:    Lifitegrast (XIIDRA OP), Apply to eye., Disp: , Rfl:    methylphenidate 27 MG PO CR tablet, Take 27 mg by mouth daily., Disp: , Rfl:    minocycline (MINOCIN) 50 MG capsule, Take 50 mg by mouth daily., Disp: ,  Rfl:    penicillin v potassium (VEETID) 500 MG tablet, Take 1 tablet (500 mg total) by mouth 2 (two) times daily for 10 days., Disp: 20 tablet, Rfl: 0 No Known Allergies  ROS: A complete ROS was performed with pertinent positives/negatives noted in the HPI. The remainder of the ROS are negative.   Objective:   Today's Vitals   12/03/23 1342  BP: 102/64  Pulse: 76  Temp: 98.2 F (36.8 C)   TempSrc: Temporal  SpO2: 98%  Weight: 178 lb 6.4 oz (80.9 kg)  Height: 5\' 2"  (1.575 m)    GENERAL: Well-appearing, in NAD. Well nourished.  SKIN: Pink, warm and dry. No rash, lesion, ulceration, or ecchymoses.  HEENT:    HEAD: Normocephalic, non-traumatic.  EYES: Conjunctive pink without exudate. NOSE: Septum midline w/o deformity. Nares patent, mucosa pink and non-inflamed w/o drainage.  THROAT: Uvula midline. Oropharynx erythematous. Tonsils non-inflamed w/o exudate. Mucus membranes pink and moist.  NECK: Trachea midline. Full ROM w/o pain or tenderness. No lymphadenopathy.  RESPIRATORY: Chest wall symmetrical. Respirations even and non-labored. Breath sounds clear to auscultation bilaterally.  CARDIAC: S1, S2 present, regular rate and rhythm. Peripheral pulses 2+ bilaterally.  MSK: Muscle tone and strength appropriate for age.  EXTREMITIES: Without clubbing, cyanosis, or edema.  NEUROLOGIC: No motor or sensory deficits. Steady, even gait.  PSYCH/MENTAL STATUS: Alert, oriented x 3. Cooperative, appropriate mood and affect.   Health Maintenance Due  Topic Date Due   HIV Screening  Never done   Hepatitis C Screening  Never done   DTaP/Tdap/Td (7 - Td or Tdap) 06/11/2022    Results for orders placed or performed in visit on 12/03/23  POCT rapid strep A  Result Value Ref Range   Rapid Strep A Screen Positive (A) Negative    Assessment & Plan:  Assessment and Plan    Sore throat Confirmed Group A Streptococcus infection. - Prescribe penicillin VK 500 mg BID for 10 days. - Advise discontinuation of minocycline while taking penicillin and resume after completion. - Instruct to change toothbrush two days after starting antibiotics. - Recommend warm salt water gargles and throat lozenges as needed. - Advise use of chloraseptic spray for sore throat as needed.  Eye pain Possible side effect of minocycline. Awaiting neurology evaluation to rule out shunt-related issues. - Use  Xiidra eye drops, one drop in each eye twice daily. - Follow up with neurology for further evaluation, including MRI and possible lumbar puncture.  Seizure disorder Controlled with Keppra. - Continue Keppra 1000 mg twice daily. - Follow up with neurology as needed.  Anxiety Well-managed with Buspar. - Continue Buspar 5 mg once daily.  Attention-deficit/hyperactivity disorder (ADHD) Well-managed with Concerta. - Continue Concerta 27 mg once daily.  Acne Clear skin with minocycline, monitor for eye pain as a side effect. - Continue minocycline for acne management. - Monitor for any side effects related to minocycline, particularly eye pain.  Low back pain Likely due to heavy backpack and poor-quality dorm bed. - Advise using a rolling backpack to reduce strain on the back. - Continue using heat and ice for pain relief. - Use ibuprofen or Tylenol as needed for pain management.       Orders Placed This Encounter  Procedures   POCT rapid strep A   Meds ordered this encounter  Medications   penicillin v potassium (VEETID) 500 MG tablet    Sig: Take 1 tablet (500 mg total) by mouth 2 (two) times daily for 10 days.  Dispense:  20 tablet    Refill:  0    Supervising Provider:   Garnette Gunner [[id number]]    Return in about 6 months (around 06/04/2024) for Annual Physical Exam with fasting lab work.   Salvatore Decent, FNP

## 2023-12-04 ENCOUNTER — Telehealth: Payer: Self-pay | Admitting: Internal Medicine

## 2023-12-04 NOTE — Telephone Encounter (Signed)
 Copied from CRM [id number]. Topic: Clinical - Medication Question >> Dec 04, 2023  9:48 AM Pascal Lux wrote: Reason for CRM: Patient took the night dose of penicilln and while reviewing the summary notes this morning she saw she should not take penicillin v potassium (VEETID) 500 MG tablet [[id number]] and minocycline (MINOCIN) 50 MG capsule [[id number]] together. Patient wants to know if she can still take penicillin v potassium (VEETID) 500 MG tablet [[id number]] this evening, even though she just took Minocycline this morning. -- Patient also wants to know if it's supposed to be taken all in 10 days or 10 days from appointment?

## 2023-12-04 NOTE — Telephone Encounter (Signed)
**Note De-identified  [place] Obfuscation** Please advise 

## 2023-12-04 NOTE — Telephone Encounter (Signed)
 Returned call to patient and she was informed of message.

## 2023-12-04 NOTE — Telephone Encounter (Signed)
 As discussed at her appointment, do NOT take minocycline while taking the penicillin. The penicillin should be taken starting today, take 1 tablet 2 times a day for 10 days straight. THEN she can resume taking her minocycline.

## 2023-12-25 IMAGING — CT CT HEAD W/O CM
4 series · 16 of 47 positions shown, 18 images · non-contrast
Comparison: None.

CLINICAL DATA: Sudden onset severe headache, seizure



[Series 3: head wo · axial · 0.44mm/px · z∈[-170,-40]mm · 7 of 36 slices shown, 9 images]
[im 5/36  brain]
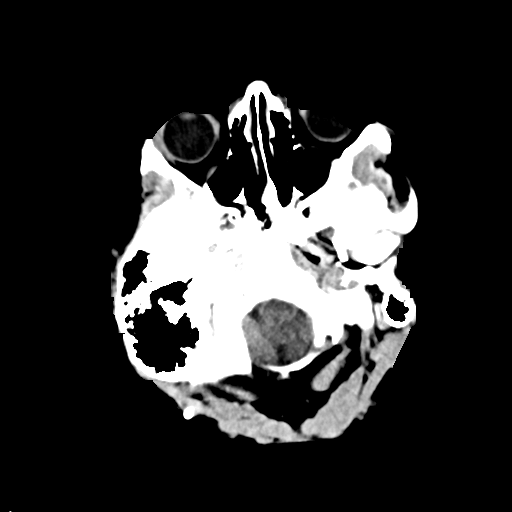
[im 5/36  bone]
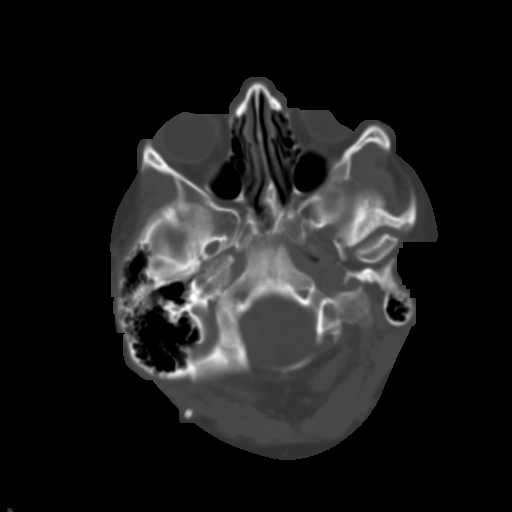
[im 9/36  brain]
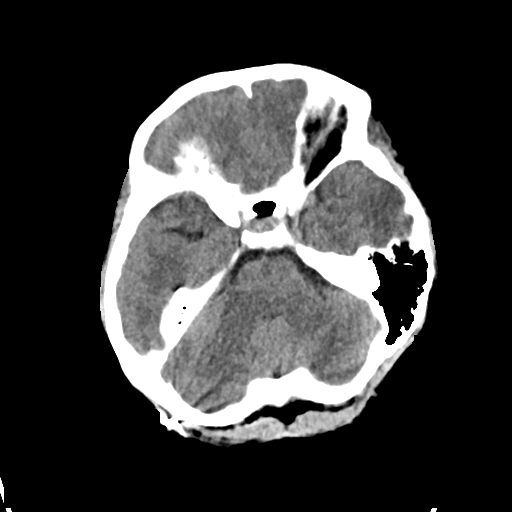
[im 14/36  brain]
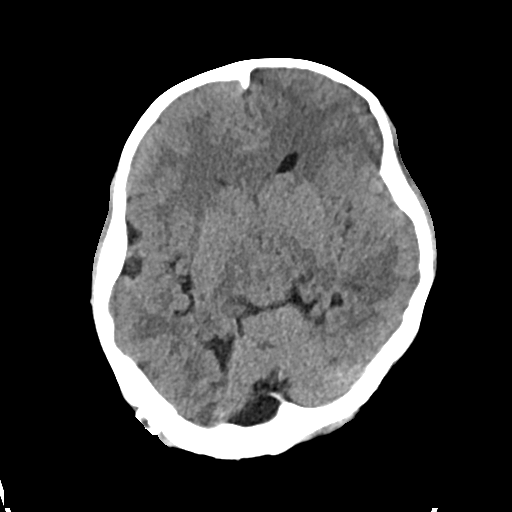
[im 18/36  brain]
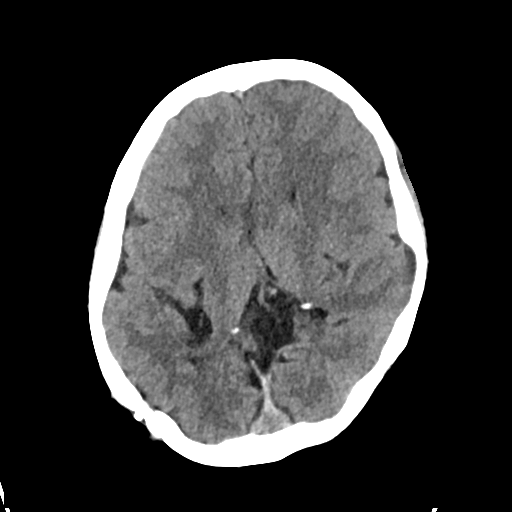
[im 22/36  brain]
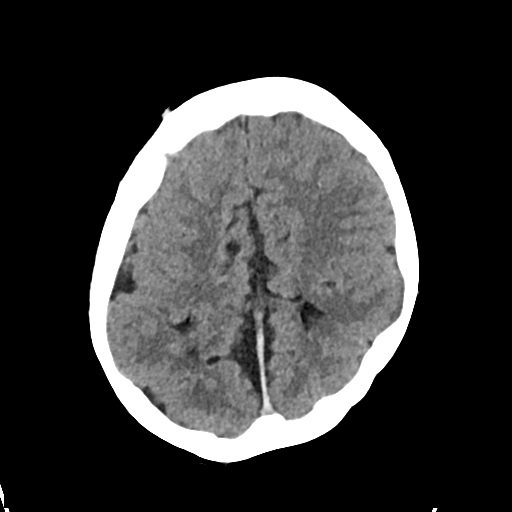
[im 22/36  bone]
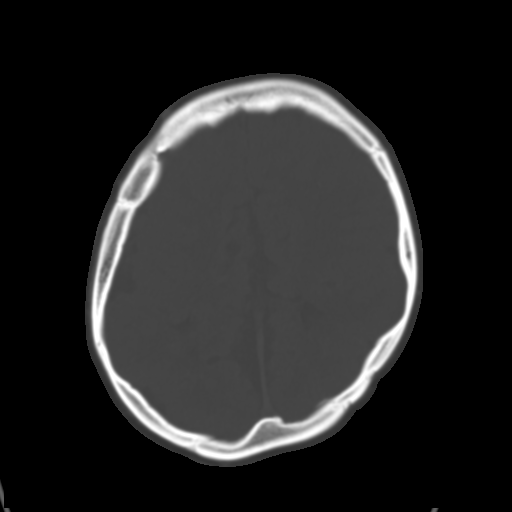
[im 27/36  brain]
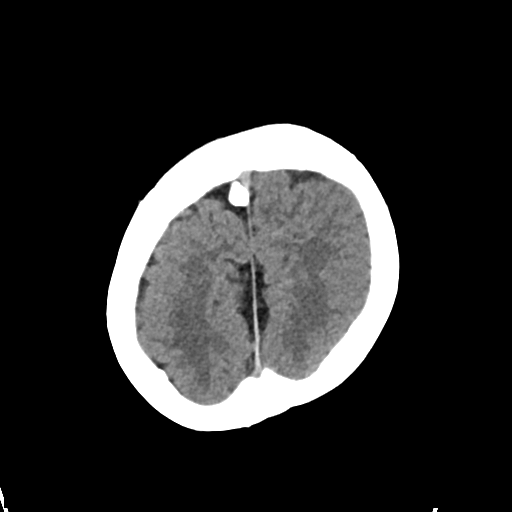
[im 31/36  brain]
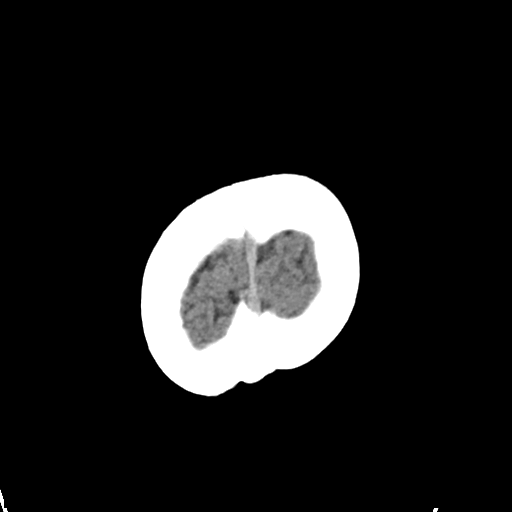

[Series 4: head bone · axial · 0.44mm/px · z∈[-174,-138]mm · 3 of 90 slices shown]
[im 9/90  bone]
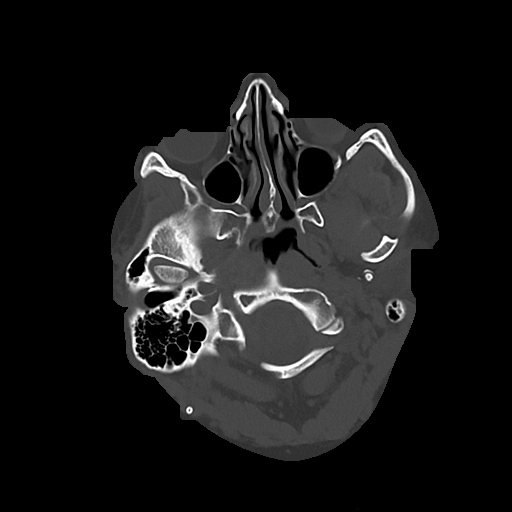
[im 18/90  bone]
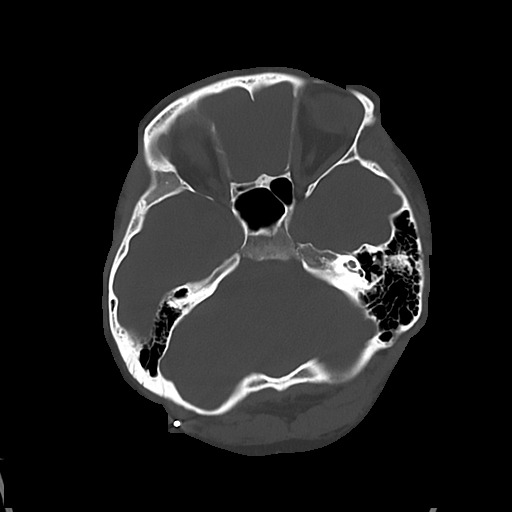
[im 27/90  bone]
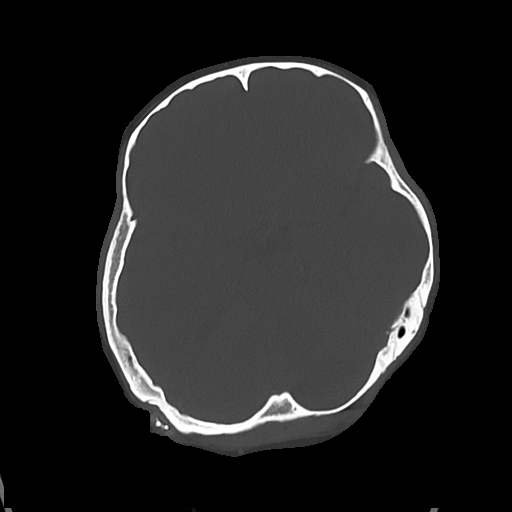

[Series 5: cor soft · coronal · 0.35mm/px · 3 of 67 slices shown]
[im 23/67  brain]
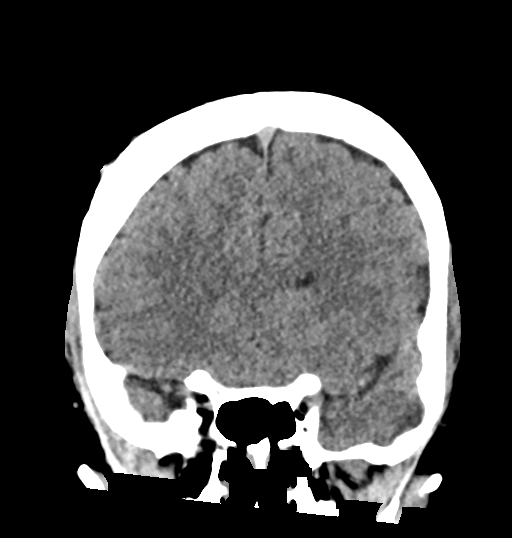
[im 30/67  brain]
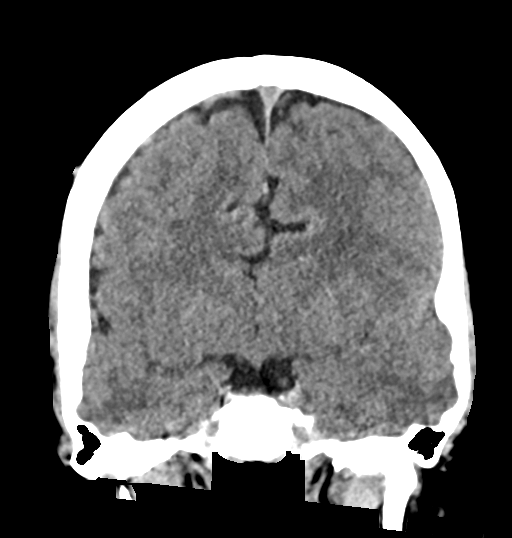
[im 37/67  brain]
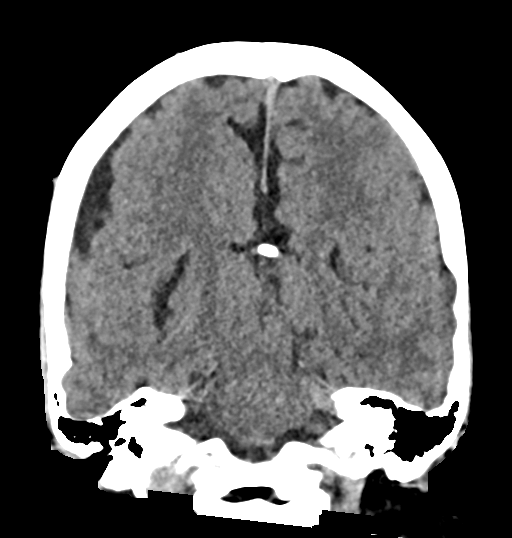

[Series 6: sag soft · sagittal · 0.38mm/px · 3 of 60 slices shown]
[im 21/60  brain]
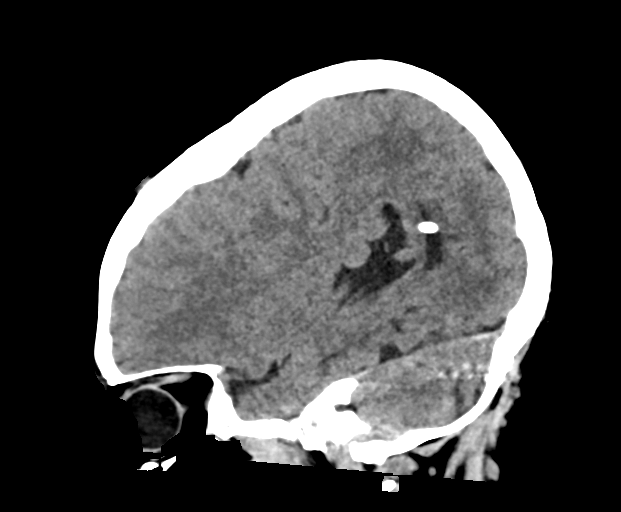
[im 30/60  brain]
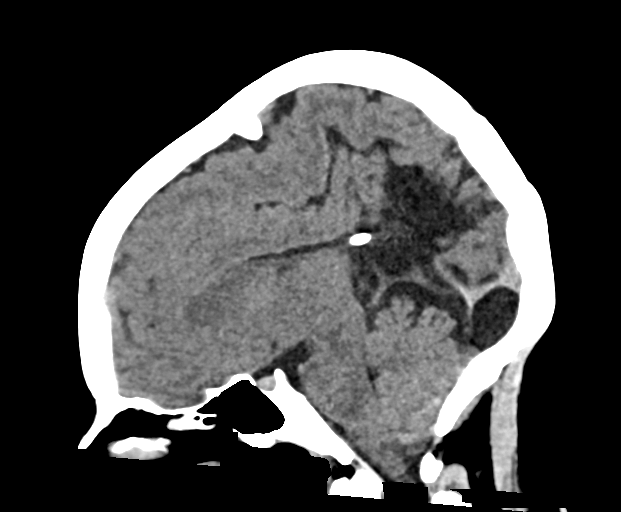
[im 40/60  brain]
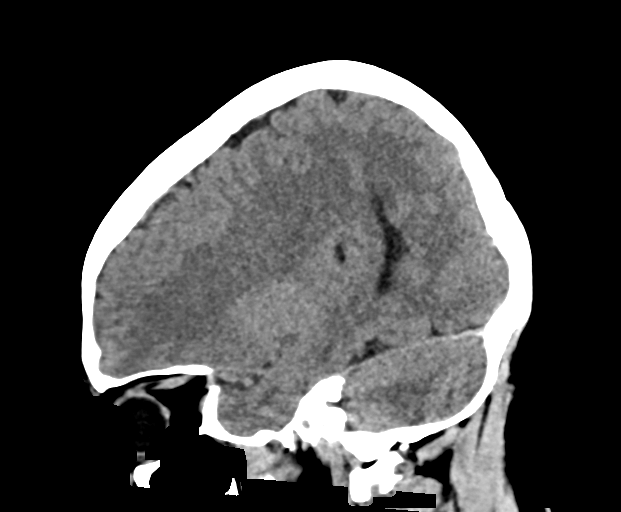

[16 of 47 positions shown; findings below may reference images not displayed]

FINDINGS: Brain: Ventriculostomy catheter via a right parietooccipital
approach, tip in the region of the left thalamus. There is complete
decompression of the lateral ventricles.

Congenital abnormalities within the bilateral parietooccipital
regions. Partial agenesis of the corpus callosum. No acute infarct
or hemorrhage. Midline structures are otherwise unremarkable. No
acute extra-axial fluid collections. No mass effect.

Vascular: No hyperdense vessel or unexpected calcification.

Skull: Normal. Negative for fracture or focal lesion.

Sinuses/Orbits: No acute finding.

Other: None.
IMPRESSION: 1. Congenital abnormalities of the bilateral parietal and occipital
lobes, as well as partial agenesis of the corpus callosum.
2. No acute intracranial process.
3. Ventriculostomy catheter via a right parietooccipital approach,
with complete decompression of the ventricular system.

## 2023-12-29 DIAGNOSIS — Q044 Septo-optic dysplasia of brain: Secondary | ICD-10-CM | POA: Diagnosis not present

## 2024-01-02 ENCOUNTER — Other Ambulatory Visit: Payer: Self-pay | Admitting: Internal Medicine

## 2024-01-04 ENCOUNTER — Other Ambulatory Visit (HOSPITAL_BASED_OUTPATIENT_CLINIC_OR_DEPARTMENT_OTHER): Payer: Self-pay

## 2024-01-04 MED ORDER — METHYLPHENIDATE HCL ER (OSM) 27 MG PO TBCR
27.0000 mg | EXTENDED_RELEASE_TABLET | Freq: Every day | ORAL | 0 refills | Status: DC
  Filled 2024-01-04: qty 30, 30d supply, fill #0

## 2024-01-04 NOTE — Telephone Encounter (Signed)
 Last Ov 12/03/23 Filled by historical provider

## 2024-01-28 ENCOUNTER — Other Ambulatory Visit (HOSPITAL_BASED_OUTPATIENT_CLINIC_OR_DEPARTMENT_OTHER): Payer: Self-pay

## 2024-01-28 ENCOUNTER — Encounter: Payer: Self-pay | Admitting: Internal Medicine

## 2024-01-28 DIAGNOSIS — F900 Attention-deficit hyperactivity disorder, predominantly inattentive type: Secondary | ICD-10-CM

## 2024-01-29 MED ORDER — METHYLPHENIDATE HCL ER (OSM) 27 MG PO TBCR
27.0000 mg | EXTENDED_RELEASE_TABLET | Freq: Every day | ORAL | 0 refills | Status: DC
Start: 2024-01-29 — End: 2024-03-07

## 2024-02-17 DIAGNOSIS — H5710 Ocular pain, unspecified eye: Secondary | ICD-10-CM | POA: Diagnosis not present

## 2024-02-17 DIAGNOSIS — Q044 Septo-optic dysplasia of brain: Secondary | ICD-10-CM | POA: Diagnosis not present

## 2024-02-17 DIAGNOSIS — Z982 Presence of cerebrospinal fluid drainage device: Secondary | ICD-10-CM | POA: Diagnosis not present

## 2024-02-17 DIAGNOSIS — Q040 Congenital malformations of corpus callosum: Secondary | ICD-10-CM | POA: Diagnosis not present

## 2024-03-07 ENCOUNTER — Encounter: Payer: Self-pay | Admitting: Internal Medicine

## 2024-03-07 DIAGNOSIS — F900 Attention-deficit hyperactivity disorder, predominantly inattentive type: Secondary | ICD-10-CM

## 2024-03-07 DIAGNOSIS — L709 Acne, unspecified: Secondary | ICD-10-CM

## 2024-03-07 MED ORDER — METHYLPHENIDATE HCL ER (OSM) 27 MG PO TBCR: 27.0000 mg | EXTENDED_RELEASE_TABLET | ORAL | 0 refills | Status: DC

## 2024-03-07 MED ORDER — MINOCYCLINE HCL 50 MG PO CAPS: 50.0000 mg | ORAL_CAPSULE | Freq: Every day | ORAL | 2 refills | Status: DC

## 2024-03-07 MED ORDER — METHYLPHENIDATE HCL ER (OSM) 27 MG PO TBCR: 27.0000 mg | EXTENDED_RELEASE_TABLET | Freq: Every day | ORAL | 0 refills | Status: DC

## 2024-04-07 ENCOUNTER — Encounter: Payer: Self-pay | Admitting: Internal Medicine

## 2024-05-13 ENCOUNTER — Encounter: Payer: Self-pay | Admitting: Internal Medicine

## 2024-05-13 DIAGNOSIS — F900 Attention-deficit hyperactivity disorder, predominantly inattentive type: Secondary | ICD-10-CM

## 2024-05-16 MED ORDER — METHYLPHENIDATE HCL ER (OSM) 27 MG PO TBCR: 27.0000 mg | EXTENDED_RELEASE_TABLET | ORAL | 0 refills | Status: AC

## 2024-05-16 MED ORDER — METHYLPHENIDATE HCL ER (OSM) 27 MG PO TBCR: 27.0000 mg | EXTENDED_RELEASE_TABLET | ORAL | 0 refills | Status: DC

## 2024-05-16 MED ORDER — METHYLPHENIDATE HCL ER (OSM) 27 MG PO TBCR: 27.0000 mg | EXTENDED_RELEASE_TABLET | Freq: Every day | ORAL | 0 refills | Status: AC

## 2024-06-07 ENCOUNTER — Encounter: Payer: Self-pay | Admitting: Internal Medicine

## 2024-06-07 ENCOUNTER — Ambulatory Visit: Admitting: Internal Medicine

## 2024-06-07 VITALS — BP 110/75 | HR 75 | Temp 97.7°F | Ht 62.0 in | Wt 178.6 lb

## 2024-06-07 DIAGNOSIS — L709 Acne, unspecified: Secondary | ICD-10-CM | POA: Diagnosis not present

## 2024-06-07 DIAGNOSIS — Z23 Encounter for immunization: Secondary | ICD-10-CM | POA: Diagnosis not present

## 2024-06-07 DIAGNOSIS — G44209 Tension-type headache, unspecified, not intractable: Secondary | ICD-10-CM | POA: Diagnosis not present

## 2024-06-07 DIAGNOSIS — Z0001 Encounter for general adult medical examination with abnormal findings: Secondary | ICD-10-CM

## 2024-06-07 DIAGNOSIS — Z0000 Encounter for general adult medical examination without abnormal findings: Secondary | ICD-10-CM

## 2024-06-07 DIAGNOSIS — F411 Generalized anxiety disorder: Secondary | ICD-10-CM | POA: Diagnosis not present

## 2024-06-07 MED ORDER — MINOCYCLINE HCL 50 MG PO CAPS: 50.0000 mg | ORAL_CAPSULE | Freq: Every day | ORAL | 2 refills | Status: DC

## 2024-06-07 MED ORDER — BUSPIRONE HCL 10 MG PO TABS: 10.0000 mg | ORAL_TABLET | Freq: Two times a day (BID) | ORAL | 1 refills | Status: AC | PRN

## 2024-06-07 MED ORDER — METHOCARBAMOL 500 MG PO TABS: 500.0000 mg | ORAL_TABLET | Freq: Every evening | ORAL | 0 refills | Status: DC | PRN

## 2024-06-07 NOTE — Progress Notes (Signed)
 Subjective:   Sonya Miller Aug 06, 2001  06/07/2024   CC: Chief Complaint  Patient presents with   Annual Exam    Pt fasting, pain on right side of head    HPI: Sonya Miller is a 23 y.o. female who presents for a routine health maintenance exam.  Labs not collected at time of visit. Patient declines lab work.   Discussed the use of AI scribe software for clinical note transcription with the patient, who gave verbal consent to proceed.  History of Present Illness   Sonya Miller is a 22 year old female who presents for an annual physical exam and evaluation of right-sided head pain.  She has been experiencing right-sided head pain that began towards the end of last year and has been flaring up recently. 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 The pain is severe enough to disrupt her sleep, as she naturally sleeps on her right side, which exacerbates the pain. She has been using ibuprofen  almost daily, but it has not been effective.   She experiences neck pain, particularly at night when trying to sleep. She has tried using a back pillow, but it is too big and uncomfortable. She has also attempted using heat and ice packs, but finds it difficult to hold them in place due to other responsibilities. She sometimes wakes up with a headache, especially when she inadvertently sleeps on her right side, which puts pressure on the area near her shunt.  She has a history of generalized anxiety disorder and has been taking Buspar  5 mg daily, but feels the dosage is insufficient and anxiety has recently worsened. She struggles with overthinking and overstimulation, which has worsened following a recent stressful event. She is currently in counseling for her anxiety and finds it challenging to find motivation for daily tasks, including schoolwork.  She is a Archivist, walks regularly, and  participates in martial arts three days a week. No smoking, alcohol, or drug use. She is not currently sexually active but has been in the past. Her last menstrual cycle was last month, and she has experienced regular cycles since her last sexual activity a few months ago.   Needs refill of Minocycline  for acne.   HEALTH SCREENINGS: - Pap smear: declined - Mammogram (40+): Not applicable  - Colonoscopy (45+): Not applicable  - Bone Density (65+): Not applicable  - Lung CA screening with low-dose CT:  Not applicable Adults age 60-80 who are current cigarette smokers or quit within the last 15 years. Must have 20 pack year history.   IMMUNIZATIONS: - Tdap: Tetanus vaccination status reviewed: declined. - HPV: Up to date - Influenza: Administered today   Past medical history, surgical history, medications, allergies, family history and social history reviewed with patient today and changes made to appropriate areas of the chart.   Social History   Socioeconomic History   Marital status: Single    Spouse name: Not on file   Number of children: Not on file   Years of education: Not on file   Highest education level: Associate degree: academic program  Occupational History   Not on file  Tobacco Use   Smoking status: Never   Smokeless tobacco: Never  Vaping Use   Vaping status: Never Used  Substance and Sexual Activity   Alcohol use: Never   Drug use: Never   Sexual activity: Not  Currently  Other Topics Concern   Not on file  Social History Narrative   Not on file   Social Drivers of Health   Financial Resource Strain: Patient Declined (06/06/2024)   Overall Financial Resource Strain (CARDIA)    Difficulty of Paying Living Expenses: Patient declined  Food Insecurity: Patient Declined (06/06/2024)   Hunger Vital Sign    Worried About Running Out of Food in the Last Year: Patient declined    Ran Out of Food in the Last Year: Patient declined  Transportation Needs: Unmet  Transportation Needs (06/06/2024)   PRAPARE - Administrator, Civil Service (Medical): Yes    Lack of Transportation (Non-Medical): Yes  Physical Activity: Sufficiently Active (06/06/2024)   Exercise Vital Sign    Days of Exercise per Week: 7 days    Minutes of Exercise per Session: 120 min  Stress: Stress Concern Present (06/06/2024)   [employer] of Occupational Health - Occupational Stress Questionnaire    Feeling of Stress: Very much  Social Connections: Moderately Integrated (06/06/2024)   Social Connection and Isolation Panel    Frequency of Communication with Friends and Family: More than three times a week    Frequency of Social Gatherings with Friends and Family: Patient declined    Attends Religious Services: More than 4 times per year    Active Member of [employer] or Organizations: Yes    Attends Engineer, structural: More than 4 times per year    Marital Status: Never married  Catering manager Violence: Not on file     Past Medical History:  Diagnosis Date   Anxiety    Hydrocephalus (HCC)    Seizures (HCC)     Past Surgical History:  Procedure Laterality Date   CSF SHUNT      Current Outpatient Medications on File Prior to Visit  Medication Sig   levETIRAcetam  (KEPPRA ) 1000 MG tablet Take 1,000 mg by mouth 2 (two) times daily.   [START ON 07/16/2024] methylphenidate  27 MG PO CR tablet Take 1 tablet (27 mg total) by mouth daily.   methylphenidate  27 MG PO CR tablet Take 1 tablet (27 mg total) by mouth every morning.   [START ON 06/15/2024] methylphenidate  27 MG PO CR tablet Take 1 tablet (27 mg total) by mouth every morning.   Lifitegrast (XIIDRA OP) Apply to eye.   No current facility-administered medications on file prior to visit.    No Known Allergies  History reviewed. No pertinent family history.   ROS: Denies fever, fatigue, unexplained weight loss/gain, hearing or vision changes, cardiac or respiratory complaints. Denies  neurological deficits, musculoskeletal complaints, gastrointestinal or genitourinary complaints, mental health complaints, and skin changes.   Objective:   Today's Vitals   06/07/24 0918  BP: 110/75  Pulse: 75  Temp: 97.7 F (36.5 C)  TempSrc: Temporal  SpO2: 98%  Weight: 178 lb 9.6 oz (81 kg)  Height: 5' 2 (1.575 m)    GENERAL APPEARANCE: Well-appearing, in NAD. Well nourished.  SKIN: Pink, warm and dry. Turgor normal. No rash, lesion, ulceration, or ecchymoses. Hair evenly distributed.  HEENT: HEAD: Normocephalic.  EYES: PERRLA. EOMI. Lids intact w/o defect. Sclera white, Conjunctiva pink w/o exudate.  EARS: External ear w/o redness, swelling, masses or lesions. EAC clear. TM's intact, translucent w/o bulging, appropriate landmarks visualized. Appropriate acuity to conversational tones.  NOSE: Septum midline w/o deformity. Nares patent, mucosa pink and non-inflamed w/o drainage.  THROAT: Uvula midline. Oropharynx clear. Tonsils non-inflamed w/o exudate. Oral mucosa pink  and moist.  NECK: Supple, Trachea midline. Full ROM w/o pain or tenderness. No lymphadenopathy. Thyroid non-tender w/o enlargement or palpable masses.  RESPIRATORY: Chest wall symmetrical w/o masses. Respirations even and non-labored. Breath sounds clear to auscultation bilaterally. No wheezes, rales, rhonchi, or crackles. CARDIAC: S1, S2 present, regular rate and rhythm. No gallops, murmurs, rubs, or clicks. Capillary refill <2 seconds. Peripheral pulses 2+ bilaterally. GI: Abdomen soft w/o distention. Normoactive bowel sounds. No palpable masses or tenderness. No guarding or rebound tenderness. Liver and spleen w/o tenderness or enlargement. No CVA tenderness.  GU: declined exam.  MSK: Muscle tone and strength appropriate for age, w/o atrophy or abnormal movement.  EXTREMITIES: Active ROM intact, w/o tenderness, crepitus, or contracture. No obvious joint deformities or effusions. No clubbing, edema, or cyanosis.   NEUROLOGIC: CN's II-XII intact. Motor strength symmetrical with no obvious weakness. No sensory deficits. Steady, even gait.  PSYCH/MENTAL STATUS: Alert, oriented x 3. Cooperative, appropriate mood and affect.    Depression and Anxiety Screen done today and results listed below:     06/07/2024    9:29 AM 12/03/2023    1:46 PM  Depression screen PHQ 2/9  Decreased Interest 2 1  Down, Depressed, Hopeless 1 1  PHQ - 2 Score 3 2  Altered sleeping 3 2  Tired, decreased energy 3 2  Change in appetite 2 3  Feeling bad or failure about yourself  2 1  Trouble concentrating 3 3  Moving slowly or fidgety/restless 1 2  Suicidal thoughts 0 0  PHQ-9 Score 17 15  Difficult doing work/chores Somewhat difficult Somewhat difficult      06/07/2024    9:29 AM 12/03/2023    1:46 PM  GAD 7 : Generalized Anxiety Score  Nervous, Anxious, on Edge 3 2  Control/stop worrying 3 3  Worry too much - different things 3 3  Trouble relaxing 3 3  Restless 3 1  Easily annoyed or irritable 0 0  Afraid - awful might happen 2 2  Total GAD 7 Score 17 14  Anxiety Difficulty Very difficult Very difficult    Assessment & Plan:  1. Annual physical exam (Primary) - declined lab work  - repeat annual physical in 1 year  2. Muscle tension headache - neck stretches  - heating pad  - methocarbamol (ROBAXIN) 500 MG tablet; Take 1 tablet (500 mg total) by mouth at bedtime as needed for muscle spasms.  Dispense: 60 tablet; Refill: 0  3. Generalized anxiety disorder - increase buspar  from 5mg  , to 10mg  BID PRN  - busPIRone  (BUSPAR ) 10 MG tablet; Take 1 tablet (10 mg total) by mouth 2 (two) times daily as needed (anxiety or panic attacks).  Dispense: 180 tablet; Refill: 1  4. Acne, unspecified acne type - minocycline  (MINOCIN ) 50 MG capsule; Take 1 capsule (50 mg total) by mouth daily.  Dispense: 90 capsule; Refill: 2  5. Immunization due - Flu vaccine trivalent PF, 6mos and  older(Flulaval,Afluria,Fluarix,Fluzone)   Orders Placed This Encounter  Procedures   Flu vaccine trivalent PF, 6mos and older(Flulaval,Afluria,Fluarix,Fluzone)    PATIENT COUNSELING:  - Encouraged a healthy well-balanced diet. Patient may adjust caloric intake to maintain or achieve ideal body weight. May reduce intake of dietary saturated fat and total fat and have adequate dietary potassium and calcium preferably from fresh fruits, vegetables, and low-fat dairy products.    - Stressed the importance of regular exercise  NEXT PREVENTATIVE PHYSICAL DUE IN 1 YEAR.  Return in about 6 months (around 12/05/2024)  for ADD/ADHD, anxiety.  Rosina Senters, FNP

## 2024-06-20 ENCOUNTER — Encounter: Payer: Self-pay | Admitting: Internal Medicine

## 2024-06-20 DIAGNOSIS — F900 Attention-deficit hyperactivity disorder, predominantly inattentive type: Secondary | ICD-10-CM

## 2024-06-20 NOTE — Telephone Encounter (Signed)
 Patient will need to make an appt to discuss menstrual cycles. Also, please notify patient there are 2 additional refills at pharmacy for her concerta . Just call pharmacy to fill.

## 2024-06-20 NOTE — Telephone Encounter (Signed)
 Pt requesting refill on methylphenidate  27 MG PO CR tablet   LOV 06/07/24 FOV  not scheduled LRF 07/16/24

## 2024-06-20 NOTE — Telephone Encounter (Signed)
 Pt has refill at pharmacy already and also made appointment for 10/14 for office visit

## 2024-06-21 ENCOUNTER — Ambulatory Visit: Admitting: Internal Medicine

## 2024-06-21 ENCOUNTER — Encounter: Payer: Self-pay | Admitting: Internal Medicine

## 2024-06-21 VITALS — BP 108/67 | HR 68 | Temp 97.2°F | Ht 62.0 in | Wt 179.0 lb

## 2024-06-21 DIAGNOSIS — Z30011 Encounter for initial prescription of contraceptive pills: Secondary | ICD-10-CM | POA: Diagnosis not present

## 2024-06-21 DIAGNOSIS — K529 Noninfective gastroenteritis and colitis, unspecified: Secondary | ICD-10-CM | POA: Diagnosis not present

## 2024-06-21 LAB — POCT URINE PREGNANCY: Preg Test, Ur: NEGATIVE

## 2024-06-21 MED ORDER — NORETHINDRONE ACET-ETHINYL EST 1-20 MG-MCG PO TABS: 1.0000 | ORAL_TABLET | Freq: Every day | ORAL | 3 refills | Status: AC

## 2024-06-21 NOTE — Patient Instructions (Signed)
 Diarrhea:  Bland diet  Pepto-bismol as needed  Imodium as needed  Stay hydrated    Starting Your Oral Contraception:  1) First Day Start - Take your first pill during the first 24 hours of your menstrual cycle. No back-up contraceptivemethod is needed when the pill is started the first day of your menses.  2) Sunday Start - Wait until the first Sunday after your menstrual cycle begins to take your first pill. With this optionuse another method of birth control for the first 7 days of the first cycle only.  3) Quick Start - Start the pill today. If you have had unprotected sexual intercourse since your last period, perform a pregnancy test prior to starting the pill. If it is negative, start the pill today. Use another method of birth control such as condoms or spermicide the first seven days of the first cycle of use.  Oral Contraception Answers to Common Questions.   1)  Take your birth control pill at the same time every day. This ensures that a constant hormone level is maintained at all times.  2) Should you experience nausea or vomiting, try taking your pill after a meal or at bedtime.  3)  Irregular vaginal bleeding or spotting may occur while you are taking the pills, especially during the first few months of oral contraceptive use. If you experience spotting or break-through bleeding, (bleeding that occurs outside of the placebo week) that occurs after the first 3 cycles, or lasts for more than a few days, see your health care provider.  4) Birth control pills do not protect you from sexually transmitted infections.  INSTRUCTIONS FOR MISSED PILLS: 1 active pill < 24 hours late in any week: Take 1 active pill ASAP* and continue pack as usual.  Missed 1 or more active pills (i.e., >24 hours late): If during week 1: Take 1 active pill ASAP* and continue pack as usual. Back-up contraception for 7 days. Consider Emergency Contraception (EC) if unprotected intercourse occurred  within the 5 days prior to missing pill.  If during week 2 or 3 and missed < 3 pills: Take 1 active pill ASAP* and continue active pills as usual, but discard placebo pills and start a new pack  If during week 2 or 3 and >= 3 pills missed: Take 1 active pill ASAP* and continue active pills as usual, but discard placebo pills and start a new pack Back-up contraception for 7 days. Consider EC if repeated or prolonged omission, or if unprotected intercourse occurred during the time the pills were missed and up until seven active pills have been taken  Instructions for missed extended or continuous hormonal contraceptives: Missed pill after 21 consecutive days of extended or continuous use, up to 7 days can be missed.  If > 7 days missed, instructions would be the same as for cyclic users who have missed/delayed combined hormonal contraceptive in the first week of use.  When the extended/continuous regimen is resumed, recommendations for cyclic users for missed/delayed combined hormonal contraceptive during the first 21 consecutive days of use should be followed.  **If you still are not sure what to do about the pills you have missed, use a back-up method anytime you have sex. Keep taking one pill each day until you can reach your doctor or clinic.  MEDICATIONS AND BIRTH CONTROL PILLS: The following medications and supplements may interfere with the effectiveness of CHC:  some antibiotics  anticonvulsants  St. John's Wort  Provigil  It is recommend  that if you take the above medications and supplements and are sexually active with a female partner, you use a back-up method of birth control while using the medication and for 7 consecutive days once the medication is completed.  IF YOU EXPERIENCE ANY OF THE FOLLOWING, PLEASE CALL IMMEDIATELY OR GO TO THE EMERGENCY ROOM:  severe abdominal pain or tenderness in the lower abdomen chest pain, sharp, sudden shortness of breath or coughing up  blood headache, severe and sudden, or vomiting, dizziness or faintness eyesight problems, such as sudden blurred or doubled vision or flashes of light severe pain or swelling in calf or groin

## 2024-06-21 NOTE — Progress Notes (Unsigned)
 [facility] [address] [place] [address] Dept: [phone] Dept Fax: [phone]  Acute Care Office Visit  Subjective:   Sonya Miller [birth year] 06/21/2024  Chief Complaint  Patient presents with   Dysmenorrhea    Having cramps that feel like cycle is on occurring for 1 week, constipation, upset stomach    HPI:  History of Present Illness   Sonya Miller is a 23 year old female who presents with abdominal pain and irregular bowel movements.  She has been experiencing intermittent abdominal pain and muscle tension over the past few weeks. The abdominal pain is cramping, similar to menstrual cramps, but she is not currently on her cycle. The pain is located near her pelvis and is associated with lower back pain and stomach discomfort.  She has been experiencing constipation followed by episodes of diarrhea for the past three days. She has bowel movements two to three times a day, with some stools appearing green or black. No blood in her stool. Her last bowel movement was this morning. No fever, chills, nausea, or vomiting. She has not taken any medication specifically for the diarrhea but has used Pepto Bismol in the past for constipation and took Milk of Magnesia about a week ago.  She is concerned about her irregular menstrual cycles, noting her last period was in the first week of September. Her cycles have been irregular since menarche at age 33, with the longest interval being three months. She is currently tracking her cycles and is supposed to be on her period now. She has previously tried birth control to regulate her cycles but experienced adverse reactions due to interactions with her other medications, including Keppra . She is sexually active with 1 female partner, uses condoms for protection. Last sexual encounter was 2 weeks ago.   She mentions a bruise on her left breast, which she discovered over the weekend.  She is unsure of how it occurred but notes it was initially darker and has since changed color.       The following portions of the patient's history were reviewed and updated as appropriate: past medical history, past surgical history, family history, social history, allergies, medications, and problem list.   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 No pertinent family history.  Current Outpatient Medications:    busPIRone  (BUSPAR ) 10 MG tablet, Take 1 tablet (10 mg total) by mouth 2 (two) times daily as needed (anxiety or panic attacks)., Disp: 180 tablet, Rfl: 1   levETIRAcetam  (KEPPRA ) 1000 MG tablet, Take 1,000 mg by mouth 2 (two) times daily., Disp: , Rfl:    methylphenidate  27 MG PO CR tablet, Take 1 tablet (27 mg total) by mouth every morning., Disp: 30 tablet, Rfl: 0   minocycline  (MINOCIN ) 50 MG capsule, Take 1 capsule (50 mg total) by mouth daily., Disp: 90 capsule, Rfl: 2   norethindrone-ethinyl estradiol (JUNEL 1/20) 1-20  MG-MCG tablet, Take 1 tablet by mouth daily., Disp: 84 tablet, Rfl: 3   [START ON 07/16/2024] methylphenidate  27 MG PO CR tablet, Take 1 tablet (27 mg total) by mouth daily., Disp: 30 tablet, Rfl: 0   methylphenidate  27 MG PO CR tablet, Take 1 tablet (27 mg total) by mouth every morning., Disp: 30 tablet, Rfl: 0 No Known Allergies   ROS: A complete ROS was performed with  pertinent positives/negatives noted in the HPI. The remainder of the ROS are negative.    Objective:   Today's Vitals   06/21/24 1538  BP: 108/67  Pulse: 68  Temp: (!) 97.2 F (36.2 C)  TempSrc: Temporal  SpO2: 95%  Weight: 179 lb (81.2 kg)  Height: 5' 2 (1.575 m)    GENERAL: Well-appearing, in NAD. Well nourished.  SKIN: Pink, warm and dry. 2cm yellow-purple ecchymosis to left lateral breast RESPIRATORY: Chest wall symmetrical. Respirations even and non-labored. Breath sounds clear to auscultation bilaterally.  CARDIAC: S1, S2 present, regular rate and rhythm. Peripheral pulses 2+ bilaterally.  GI: Abdomen soft, non-tender. Normoactive bowel sounds. No rebound tenderness. No hepatomegaly or splenomegaly. No CVA tenderness.  EXTREMITIES: Without clubbing, cyanosis, or edema.  PSYCH/MENTAL STATUS: Alert, oriented x 3. Cooperative, appropriate mood and affect.    Results for orders placed or performed in visit on 06/21/24  POCT urine pregnancy  Result Value Ref Range   Preg Test, Ur Negative Negative      Assessment & Plan:  Assessment and Plan    Gastroenteritis - Advise bland diet, avoid spicy and acidic foods. - Recommend Pepto Bismol as needed for diarrhea. - Suggest Imodium as needed for diarrhea. - Encourage adequate hydration, consider Pedialyte for rehydration. - Instruct to return if symptoms worsen or fever develops.  Encounter for initial prescription of contraception pills  Discussed contraceptive options considering current medications, preference for oral contraceptives if safe with Keppra . - Perform urine pregnancy test before starting contraceptive. - If negative, initiate Junel 1/20 once daily. Advised to take 2-3 hours after taking her keppra .  - Schedule follow-up in two months for blood pressure and weight check.      Meds ordered this encounter  Medications   norethindrone-ethinyl estradiol (JUNEL 1/20) 1-20 MG-MCG tablet    Sig: Take 1 tablet  by mouth daily.    Dispense:  84 tablet    Refill:  3    Supervising Provider:   THOMPSON, AARON B [[id number]]   Orders Placed This Encounter  Procedures   POCT urine pregnancy   Lab Orders         POCT urine pregnancy     No images are attached to the encounter or orders placed in the encounter.  Return in about 2 months (around 08/21/2024) for birth control (BP and weight check).   Rosina Senters, FNP

## 2024-06-23 DIAGNOSIS — Z30011 Encounter for initial prescription of contraceptive pills: Secondary | ICD-10-CM | POA: Insufficient documentation

## 2024-07-12 ENCOUNTER — Encounter (HOSPITAL_BASED_OUTPATIENT_CLINIC_OR_DEPARTMENT_OTHER): Payer: Self-pay | Admitting: Emergency Medicine

## 2024-07-12 ENCOUNTER — Emergency Department (HOSPITAL_BASED_OUTPATIENT_CLINIC_OR_DEPARTMENT_OTHER)
Admission: EM | Admit: 2024-07-12 | Discharge: 2024-07-12 | Disposition: A | Discharge status: Home / Self Care | Attending: Emergency Medicine | Admitting: Emergency Medicine

## 2024-07-12 ENCOUNTER — Other Ambulatory Visit: Payer: Self-pay

## 2024-07-12 DIAGNOSIS — J029 Acute pharyngitis, unspecified: Secondary | ICD-10-CM | POA: Insufficient documentation

## 2024-07-12 DIAGNOSIS — R5381 Other malaise: Secondary | ICD-10-CM | POA: Diagnosis not present

## 2024-07-12 DIAGNOSIS — R070 Pain in throat: Secondary | ICD-10-CM | POA: Diagnosis not present

## 2024-07-12 DIAGNOSIS — I10 Essential (primary) hypertension: Secondary | ICD-10-CM | POA: Diagnosis not present

## 2024-07-12 DIAGNOSIS — G40109 Localization-related (focal) (partial) symptomatic epilepsy and epileptic syndromes with simple partial seizures, not intractable, without status epilepticus: Secondary | ICD-10-CM | POA: Diagnosis not present

## 2024-07-12 LAB — RESP PANEL BY RT-PCR (RSV, FLU A&B, COVID)  RVPGX2
Influenza A by PCR: NEGATIVE
Influenza B by PCR: NEGATIVE
Resp Syncytial Virus by PCR: NEGATIVE
SARS Coronavirus 2 by RT PCR: NEGATIVE

## 2024-07-12 LAB — GROUP A STREP BY PCR: Group A Strep by PCR: NOT DETECTED

## 2024-07-12 MED ORDER — DEXAMETHASONE 4 MG PO TABS
10.0000 mg | ORAL_TABLET | Freq: Once | ORAL | Status: AC
  Administered 2024-07-12: 10 mg via ORAL
  Filled 2024-07-12: qty 3

## 2024-07-12 MED ORDER — NAPROXEN 500 MG PO TABS: 500.0000 mg | ORAL_TABLET | Freq: Two times a day (BID) | ORAL | 0 refills | Status: DC

## 2024-07-12 NOTE — ED Provider Notes (Signed)
 [facility] Emergency Department Provider Note MRN:  [id number]  Arrival date & time: 07/12/24     Chief Complaint   Sore Throat   History of Present Illness   Sonya Miller is a 23 y.o. year-old female with a history of anxiety presenting to the ED with chief complaint of sore throat.  Sore throat for the past few days, felt a chill this evening, some mild general malaise.  No other complaints.  Review of Systems  A thorough review of systems was obtained and all systems are negative except as noted in the HPI and PMH.   Patient's Health History    Past Medical History:  Diagnosis Date   Anxiety    Hydrocephalus (HCC)    Seizures (HCC)     Past Surgical History:  Procedure Laterality Date   BRAIN SURGERY  05/2018   Shunt replacement   CSF SHUNT      History reviewed. No pertinent family history.  Social History   Socioeconomic History   Marital status: Single    Spouse name: Not on file   Number of children: Not on file   Years of education: Not on file   Highest education level: Associate degree: academic program  Occupational History   Not on file  Tobacco Use   Smoking status: Never   Smokeless tobacco: Never  Vaping Use   Vaping status: Never Used  Substance and Sexual Activity   Alcohol use: Never   Drug use: Never   Sexual activity: Not Currently  Other Topics Concern   Not on file  Social History Narrative   Not on file   Social Drivers of Health   Financial Resource Strain: Patient Declined (06/06/2024)   Overall Financial Resource Strain (CARDIA)    Difficulty of Paying Living Expenses: Patient declined  Food Insecurity: Patient Declined (06/06/2024)   Hunger Vital Sign    Worried About Running Out of Food in the Last Year: Patient declined    Ran Out of Food in the Last Year: Patient declined  Transportation Needs: Unmet Transportation Needs (06/06/2024)   PRAPARE - Administrator, Civil Service (Medical):  Yes    Lack of Transportation (Non-Medical): Yes  Physical Activity: Sufficiently Active (06/06/2024)   Exercise Vital Sign    Days of Exercise per Week: 7 days    Minutes of Exercise per Session: 120 min  Stress: Stress Concern Present (06/06/2024)   [employer] of Occupational Health - Occupational Stress Questionnaire    Feeling of Stress: Very much  Social Connections: Moderately Integrated (06/06/2024)   Social Connection and Isolation Panel    Frequency of Communication with Friends and Family: More than three times a week    Frequency of Social Gatherings with Friends and Family: Patient declined    Attends Religious Services: More than 4 times per year    Active Member of [employer] or Organizations: Yes    Attends Engineer, Structural: More than 4 times per year    Marital Status: Never married  Intimate Partner Violence: Not on file     Physical Exam   Vitals:   07/12/24 0146  BP: 111/69  Pulse: 70  Resp: 20  Temp: (!) 97.5 F (36.4 C)  SpO2: 97%    CONSTITUTIONAL: Well-appearing, NAD NEURO/PSYCH:  Alert and oriented x 3, no focal deficits EYES:  eyes equal and reactive ENT/NECK:  no LAD, no JVD CARDIO: Regular rate, well-perfused, normal S1 and S2 PULM:  CTAB no  wheezing or rhonchi GI/GU:  non-distended, non-tender MSK/SPINE:  No gross deformities, no edema SKIN:  no rash, atraumatic   *Additional and/or pertinent findings included in MDM below  Diagnostic and Interventional Summary    EKG Interpretation Date/Time:    Ventricular Rate:    PR Interval:    QRS Duration:    QT Interval:    QTC Calculation:   R Axis:      Text Interpretation:         Labs Reviewed  GROUP A STREP BY PCR  RESP PANEL BY RT-PCR (RSV, FLU A&B, COVID)  [id number]    No orders to display    Medications  dexamethasone (DECADRON) tablet 10 mg (10 mg Oral Given 07/12/24 0405)     Procedures  /  Critical Care Procedures  ED Course and Medical Decision Making   Initial Impression and Ddx Well-appearing in no acute distress, reassuring vital signs, minimal erythema to the posterior oropharynx with no asymmetry.  Lungs clear, no increased work of breathing, nothing to suggest emergent process.  Past medical/surgical history that increases complexity of ED encounter: None  Interpretation of Diagnostics I personally reviewed the Laboratory Testing and my interpretation is as follows: Strep negative    Patient Reassessment and Ultimate Disposition/Management     Discharge patient will follow-up on her COVID flu and RSV testing.  Patient management required discussion with the following services or consulting groups:  None  Complexity of Problems Addressed Acute complicated illness or Injury  Additional Data Reviewed and Analyzed Further history obtained from: None  Additional Factors Impacting ED Encounter Risk Prescriptions  Ozell HERO. Theadore, MD [facility] Emergency Medicine [facility] [email or web address]  Final Clinical Impressions(s) / ED Diagnoses     ICD-10-CM   1. Sore throat  J02.9       ED Discharge Orders          Ordered    naproxen (NAPROSYN) 500 MG tablet  2 times daily        07/12/24 0413             Discharge Instructions Discussed with and Provided to Patient:    Discharge Instructions      You were evaluated in the Emergency Department and after careful evaluation, we did not find any emergent condition requiring admission or further testing in the hospital.  Your exam/testing today is overall reassuring.  Symptoms seem to be due to pharyngitis.  Recommend completing the course of amoxicillin, use the Naprosyn twice daily as needed for pain.  Please return to the Emergency Department if you experience any worsening of your condition.   Thank you for allowing us  to be a part of your care.      Theadore Ozell HERO, MD 07/12/24 [phone]

## 2024-07-12 NOTE — ED Triage Notes (Addendum)
 [facility] [place] with c/o sore throat x 1 wk, [facility] to [facility] and was [facility] Amox[facility]. Pt states her symptoms no better, states she [facility] got [facility]rep [facility]

## 2024-07-12 NOTE — Discharge Instructions (Signed)
 You were evaluated in the Emergency Department and after careful evaluation, we did not find any emergent condition requiring admission or further testing in the hospital.  Your exam/testing today is overall reassuring.  Symptoms seem to be due to pharyngitis.  Recommend completing the course of amoxicillin, use the Naprosyn twice daily as needed for pain.  Please return to the Emergency Department if you experience any worsening of your condition.   Thank you for allowing us  to be a part of your care.

## 2024-07-12 NOTE — ED Notes (Signed)
Patient verbalizes understanding of discharge instructions. Opportunity for questioning and answers were provided. Armband removed by staff, pt discharged from ED. Ambulated out to lobby, awaiting ride home

## 2024-07-13 ENCOUNTER — Ambulatory Visit: Payer: Self-pay

## 2024-07-13 NOTE — Telephone Encounter (Signed)
  FYI Only or Action Required?: FYI only for provider: appointment scheduled on 07/14/24.  Patient was last seen in primary care on 06/21/2024 by Billy Knee, FNP.  Called Nurse Triage reporting Ingrown Toenail.  Symptoms began several days ago.  Interventions attempted: Other: removing ingrown nail.  Symptoms are: gradually worsening.  Triage Disposition: See Physician Within 24 Hours  Patient/caregiver understands and will follow disposition?: Yes   Copied from CRM #[id number]. Topic: Clinical - Red Word Triage >> Jul 13, 2024  4:57 PM Rea ORN wrote: Red Word that prompted transfer to Nurse Triage: Pt has ingrown toenail on left foot. Big toe is throbbing and red. Reason for Disposition  Entire toe is red  Answer Assessment - Initial Assessment Questions Used nail kit to remove but made it worse    1. LOCATION: Which toe?      Left great toe  2. APPEARANCE: What does it look like?      Red  3. ONSET: When did it start?      Several days 4. PAIN: Is there any pain? If Yes, ask: How bad is the pain?   (Scale 1-10; or mild, moderate, severe)     throbbing 5. REDNESS: Is there any redness of the skin? If Yes, ask: How much of the toe is red?     red 6. OTHER SYMPTOMS: Do you have any other symptoms? (e.g., chills, fever, red streak up foot)     Denies  Protocols used: Toenail - Ingrown-A-AH

## 2024-07-14 ENCOUNTER — Ambulatory Visit: Admitting: Nurse Practitioner

## 2024-07-14 ENCOUNTER — Encounter: Payer: Self-pay | Admitting: Nurse Practitioner

## 2024-07-14 VITALS — BP 98/68 | HR 90 | Temp 98.3°F | Ht 62.0 in | Wt 181.2 lb

## 2024-07-14 DIAGNOSIS — L600 Ingrowing nail: Secondary | ICD-10-CM | POA: Diagnosis not present

## 2024-07-14 MED ORDER — MUPIROCIN 2 % EX OINT: 1.0000 | TOPICAL_OINTMENT | Freq: Two times a day (BID) | CUTANEOUS | 0 refills | Status: AC

## 2024-07-14 NOTE — Progress Notes (Signed)
 Acute Office Visit  Subjective:    Patient ID: Sonya Miller, female    DOB: Mar 06, 2001, 23 y.o.   MRN: [id number]  Chief Complaint  Patient presents with   Nail Problem    Ingrown toenail. Tried to trim toenails but it didn't go well.     Toe Pain  The incident occurred 3 to 5 days ago. There was no injury mechanism. Pain location: left great toe. The pain is moderate. The pain has been Constant since onset. Pertinent negatives include no inability to bear weight, loss of motion, loss of sensation, muscle weakness, numbness or tingling. She reports no foreign bodies present. The symptoms are aggravated by palpation and weight bearing. Treatments tried: she attempt to trim an ingrown toenail. The treatment provided no relief.   Outpatient Medications Prior to Visit  Medication Sig   busPIRone  (BUSPAR ) 10 MG tablet Take 1 tablet (10 mg total) by mouth 2 (two) times daily as needed (anxiety or panic attacks).   levETIRAcetam  (KEPPRA ) 1000 MG tablet Take 1,000 mg by mouth 2 (two) times daily.   LORazepam (ATIVAN) 1 MG tablet Take 1 mg by mouth.   methocarbamol (ROBAXIN) 500 MG tablet Take 500 mg by mouth at bedtime as needed.   [START ON 07/16/2024] methylphenidate  27 MG PO CR tablet Take 1 tablet (27 mg total) by mouth daily.   methylphenidate  27 MG PO CR tablet Take 1 tablet (27 mg total) by mouth every morning.   methylphenidate  27 MG PO CR tablet Take 1 tablet (27 mg total) by mouth every morning.   Midazolam 5 MG/0.1ML SOLN Give 1 spray (5mg ) into 1 nostril. If no response in 10 min, may give second spray in other nostril. Do not give second dose if patient has trouble breathing or excessive sedation. Max 2 doses/seizure episode, 1 episode every 3 days or 5 episodes/month.   minocycline  (MINOCIN ) 50 MG capsule Take 1 capsule (50 mg total) by mouth daily.   naproxen (NAPROSYN) 500 MG tablet Take 1 tablet (500 mg total) by mouth 2 (two) times daily.   norethindrone-ethinyl estradiol (JUNEL  1/20) 1-20 MG-MCG tablet Take 1 tablet by mouth daily.   No facility-administered medications prior to visit.    Reviewed past medical and social history.  Review of Systems  Neurological:  Negative for tingling and numbness.   Per HPI     Objective:    Physical Exam Vitals and nursing note reviewed.  Cardiovascular:     Pulses:          Dorsalis pedis pulses are 2+ on the right side and 2+ on the left side.       Posterior tibial pulses are 2+ on the right side and 2+ on the left side.  Musculoskeletal:     Right foot: Normal range of motion.     Left foot: Normal range of motion.       Feet:  Feet:     Right foot:     Toenail Condition: Right toenails are abnormally thick.     Left foot:     Skin integrity: Erythema present.     Toenail Condition: Left toenails are abnormally thick.     Comments: Left great toe with erythema and mild swelling around cuticle, no drainage Neurological:     Mental Status: She is alert.    BP 98/68   Pulse 90   Temp 98.3 F (36.8 C)   Ht 5' 2 (1.575 m)   Wt 181 lb 3.2  oz (82.2 kg)   LMP  (LMP Unknown)   SpO2 99%   BMI 33.14 kg/m    No results found for any visits on 07/14/24.     Assessment & Plan:   Problem List Items Addressed This Visit   None Visit Diagnoses       Ingrown toenail of left foot with infection    -  Primary   Relevant Medications   mupirocin ointment (BACTROBAN) 2 %      Meds ordered this encounter  Medications   mupirocin ointment (BACTROBAN) 2 %    Sig: Apply 1 Application topically 2 (two) times daily for 7 days.    Dispense:  22 g    Refill:  0    Supervising Provider:   BERNETA FALLOW ALFRED [[id number]]   Advised to perform warm water and epsom salt soaks 2x/day and apply bactroban ointment. May need referral to podiatry if no improvement in 1week.  Return if symptoms worsen or fail to improve.    Roselie Mood, NP

## 2024-07-14 NOTE — Patient Instructions (Signed)
 Perform water water soaks 2x/day-10-29mins. Call office for podiatry referral if no improvement in 1week.  Ingrown Toenail  An ingrown toenail occurs when the corner or sides of a toenail grow into the surrounding skin. This causes discomfort and pain. The big toe is most commonly affected, but any of the toes can be affected. If an ingrown toenail is not treated, it can become infected. What are the causes? This condition may be caused by: Wearing shoes that are too small or tight. An injury, such as stubbing your toe or having your toe stepped on. Improper cutting or care of your toenails. Having nail or foot abnormalities that were present from birth (congenital abnormalities), such as having a nail that is too big for your toe. What increases the risk? The following factors may make you more likely to develop ingrown toenails: Age. Nails tend to get thicker with age, so ingrown nails are more common among older people. Cutting your toenails incorrectly, such as cutting them very short or cutting them unevenly. An ingrown toenail is more likely to get infected if you have: Diabetes. Blood flow (circulation) problems. What are the signs or symptoms? Symptoms of an ingrown toenail may include: Pain, soreness, or tenderness. Redness. Swelling. Hardening of the skin that surrounds the toenail. Signs that an ingrown toenail may be infected include: Fluid or pus. Symptoms that get worse. How is this diagnosed? Ingrown toenails may be diagnosed based on: Your symptoms and medical history. A physical exam. Labs or tests. If you have fluid or blood coming from your toenail, a sample may be collected to test for the specific type of bacteria that is causing the infection. How is this treated? Treatment depends on the severity of your symptoms. You may be able to care for your toenail at home. If you have an infection, you may be prescribed antibiotic medicines. If you have fluid or pus  draining from your toenail, your health care provider may drain it. If you have trouble walking, you may be given crutches to use. If you have a severe or infected ingrown toenail, you may need a procedure to remove part or all of the nail. Follow these instructions at home: Foot care  Check your wound every day for signs of infection, or as often as told by your health care provider. Check for: More redness, swelling, or pain. More fluid or blood. Warmth. Pus or a bad smell. Do not pick at your toenail or try to remove it yourself. Soak your foot in warm, soapy water. Do this for 20 minutes, 3 times a day, or as often as told by your health care provider. This helps to keep your toe clean and your skin soft. Wear shoes that fit well and are not too tight. Your health care provider may recommend that you wear open-toed shoes while you heal. Trim your toenails regularly and carefully. Cut your toenails straight across to prevent injury to the skin at the corners of the toenail. Do not cut your nails in a curved shape. Keep your feet clean and dry to help prevent infection. General instructions Take over-the-counter and prescription medicines only as told by your health care provider. If you were prescribed an antibiotic, take it as told by your health care provider. Do not stop taking the antibiotic even if you start to feel better. If your health care provider told you to use crutches to help you move around, use them as instructed. Return to your normal activities as told  by your health care provider. Ask your health care provider what activities are safe for you. Keep all follow-up visits. This is important. Contact a health care provider if: You have more redness, swelling, pain, or other symptoms that do not improve with treatment. You have fluid, blood, or pus coming from your toenail. You have a red streak on your skin that starts at your foot and spreads up your leg. You have a  fever. Summary An ingrown toenail occurs when the corner or sides of a toenail grow into the surrounding skin. This causes discomfort and pain. The big toe is most commonly affected, but any of the toes can be affected. If an ingrown toenail is not treated, it can become infected. Fluid or pus draining from your toenail is a sign of infection. Your health care provider may need to drain it. You may be given antibiotics to treat the infection. Trimming your toenails regularly and properly can help you prevent an ingrown toenail. This information is not intended to replace advice given to you by your health care provider. Make sure you discuss any questions you have with your health care provider. Document Revised: 12/25/2020 Document Reviewed: 12/25/2020 Elsevier Patient Education  2024 [employer].

## 2024-07-23 ENCOUNTER — Emergency Department (HOSPITAL_COMMUNITY)
Admission: EM | Admit: 2024-07-23 | Discharge: 2024-07-23 | Disposition: A | Discharge status: Home / Self Care | Attending: Emergency Medicine | Admitting: Emergency Medicine

## 2024-07-23 ENCOUNTER — Emergency Department (HOSPITAL_COMMUNITY)

## 2024-07-23 ENCOUNTER — Other Ambulatory Visit: Payer: Self-pay

## 2024-07-23 DIAGNOSIS — M545 Low back pain, unspecified: Secondary | ICD-10-CM | POA: Insufficient documentation

## 2024-07-23 DIAGNOSIS — M549 Dorsalgia, unspecified: Secondary | ICD-10-CM | POA: Diagnosis present

## 2024-07-23 DIAGNOSIS — M5459 Other low back pain: Secondary | ICD-10-CM | POA: Diagnosis not present

## 2024-07-23 LAB — COMPREHENSIVE METABOLIC PANEL WITH GFR
ALT: 19 U/L (ref 0–44)
AST: 19 U/L (ref 15–41)
Albumin: 4.3 g/dL (ref 3.5–5.0)
Alkaline Phosphatase: 57 U/L (ref 38–126)
Anion gap: 11 (ref 5–15)
BUN: 17 mg/dL (ref 6–20)
CO2: 20 mmol/L — ABNORMAL LOW (ref 22–32)
Calcium: 9.5 mg/dL (ref 8.9–10.3)
Chloride: 107 mmol/L (ref 98–111)
Creatinine, Ser: 0.76 mg/dL (ref 0.44–1.00)
GFR, Estimated: >60 mL/min (ref >60)
Glucose, Bld: 84 mg/dL (ref 70–99)
Potassium: 4.3 mmol/L (ref 3.5–5.1)
Sodium: 138 mmol/L (ref 135–145)
Total Bilirubin: 0.3 mg/dL (ref 0.0–1.2)
Total Protein: 7.4 g/dL (ref 6.5–8.1)

## 2024-07-23 LAB — CBC
HCT: 41.2 % (ref 36.0–46.0)
Hemoglobin: 14 g/dL (ref 12.0–15.0)
MCH: 31.4 pg (ref 26.0–34.0)
MCHC: 34 g/dL (ref 30.0–36.0)
MCV: 92.4 fL (ref 80.0–100.0)
Platelets: 221 K/uL (ref 150–400)
RBC: 4.46 MIL/uL (ref 3.87–5.11)
RDW: 12.9 % (ref 11.5–15.5)
WBC: 9.5 K/uL (ref 4.0–10.5)
nRBC: 0 % (ref 0.0–0.2)

## 2024-07-23 LAB — HCG, SERUM, QUALITATIVE: Preg, Serum: NEGATIVE

## 2024-07-23 MED ORDER — CYCLOBENZAPRINE HCL 10 MG PO TABS: 10.0000 mg | ORAL_TABLET | Freq: Two times a day (BID) | ORAL | 0 refills | Status: AC | PRN

## 2024-07-23 MED ORDER — KETOROLAC TROMETHAMINE 15 MG/ML IJ SOLN
15.0000 mg | Freq: Once | INTRAMUSCULAR | Status: AC
  Administered 2024-07-23: 15 mg via INTRAMUSCULAR
  Filled 2024-07-23: qty 1

## 2024-07-23 NOTE — ED Triage Notes (Signed)
 Pt arrives [facility] from [facility] for low back pain, on menstrual but saying this pain is worse than normal. Reports difficulty sleeping d/t pain and mattress  138/62 Hr 98 94% ra

## 2024-07-23 NOTE — ED Provider Notes (Signed)
 [facility] AT [facility] Provider Note   CSN: [id number] Arrival date & time: 07/23/24  9088     Patient presents with: Back Pain   Sonya Miller is a 23 y.o. female.   The history is provided by the patient and medical records. No language interpreter was used.  Back Pain    23 year old female with history of hydrocephalus, seizures, anxiety, ADHD, migraine, brought here via EMS from college campus with complaint of back pain.  States she is on day 5 of her regular menstrual period.  As she has been having pain to her low back for the past 3 days.  States she normally does have lower back pain with her menstruation but this time it appears to be much more intense.  While walking to her class yesterday, the pain was quite intense that she has to sit down on the ground to wait it out.  Sleeping on her dorm room mattress does not seems to help with the pain.  Today while walking pain was severe prompting this ER visit.  The pain is sharp, nonradiating, without any significant abdominal pain, dysuria, hematuria, fever, chills, lightheadedness or dizziness.  No radicular pain.  No recent injury.  She did saw the campus nurse for pain several days ago and received some ibuprofen  with minimal relief.  She was previously treated for pharyngitis and currently taking naproxen.  No new sexual partner.  Denies any bowel bladder incontinence or saddle anesthesia.  Prior to Admission medications   Medication Sig Start Date End Date Taking? Authorizing Provider  busPIRone  (BUSPAR ) 10 MG tablet Take 1 tablet (10 mg total) by mouth 2 (two) times daily as needed (anxiety or panic attacks). 06/07/24   Billy Knee, FNP  levETIRAcetam  (KEPPRA ) 1000 MG tablet Take 1,000 mg by mouth 2 (two) times daily.    [provider]  LORazepam (ATIVAN) 1 MG tablet Take 1 mg by mouth. 07/12/24   [provider]  methocarbamol (ROBAXIN) 500 MG tablet Take 500 mg by mouth at  bedtime as needed. 07/10/24   [provider]  methylphenidate  27 MG PO CR tablet Take 1 tablet (27 mg total) by mouth daily. 07/16/24   Billy Knee, FNP  methylphenidate  27 MG PO CR tablet Take 1 tablet (27 mg total) by mouth every morning. 05/16/24   Billy Knee, FNP  methylphenidate  27 MG PO CR tablet Take 1 tablet (27 mg total) by mouth every morning. 06/15/24   Billy Knee, FNP  Midazolam 5 MG/0.1ML SOLN Give 1 spray (5mg ) into 1 nostril. If no response in 10 min, may give second spray in other nostril. Do not give second dose if patient has trouble breathing or excessive sedation. Max 2 doses/seizure episode, 1 episode every 3 days or 5 episodes/month. 07/12/24   [provider]  minocycline  (MINOCIN ) 50 MG capsule Take 1 capsule (50 mg total) by mouth daily. 06/07/24   Billy Knee, FNP  naproxen (NAPROSYN) 500 MG tablet Take 1 tablet (500 mg total) by mouth 2 (two) times daily. 07/12/24   Bero, Michael M, MD  norethindrone-ethinyl estradiol (JUNEL 1/20) 1-20 MG-MCG tablet Take 1 tablet by mouth daily. 06/21/24   Billy Knee, FNP    Allergies: Patient has no known allergies.    Review of Systems  Musculoskeletal:  Positive for back pain.  All other systems reviewed and are negative.   Updated Vital Signs BP 111/80 (BP Location: Right Arm)   Pulse 78   Temp 98.2  F (36.8 C) (Oral)   Resp 20   Ht 5' 2 (1.575 m)   Wt 81.6 kg   LMP 07/19/2024 (Approximate)   SpO2 100%   BMI 32.92 kg/m   Physical Exam Vitals and nursing note reviewed.  Constitutional:      General: She is not in acute distress.    Appearance: She is well-developed.  HENT:     Head: Atraumatic.  Eyes:     Conjunctiva/sclera: Conjunctivae normal.  Cardiovascular:     Rate and Rhythm: Normal rate and regular rhythm.     Pulses: Normal pulses.     Heart sounds: Normal heart sounds.  Pulmonary:     Effort: Pulmonary effort is normal.  Abdominal:     Palpations: Abdomen is soft.      Tenderness: There is no abdominal tenderness. There is no right CVA tenderness or left CVA tenderness.  Musculoskeletal:        General: Tenderness (Tenderness to lumbar paralumbar spinal muscle on palpation without any overlying skin changes.  Able to ambulate) present.     Cervical back: Neck supple.  Skin:    Findings: No rash.  Neurological:     Mental Status: She is alert.  Psychiatric:        Mood and Affect: Mood normal.     (all labs ordered are listed, but only abnormal results are displayed) Labs Reviewed  COMPREHENSIVE METABOLIC PANEL WITH GFR - Abnormal; Notable for the following components:      Result Value   CO2 20 (*)    All other components within normal limits  CBC  HCG, SERUM, QUALITATIVE    EKG: None  Radiology: DG Lumbar Spine 2-3 Views Result Date: 07/23/2024 EXAM: 2 or 3 VIEW(S) XRAY OF THE LUMBAR SPINE 07/23/2024 09:44:00 AM COMPARISON: None available. CLINICAL HISTORY: back pain FINDINGS: LUMBAR SPINE: BONES: There is a slight levocurvature of the thoracolumbar spine. 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 No acute fracture. No aggressive appearing osseous lesion. DISCS AND DEGENERATIVE CHANGES: No severe degenerative changes. SOFT TISSUES: There is a catheter projecting over the right abdomen, presumably representing a ventriculoperitoneal shunt. IMPRESSION: 1. No acute abnormality of the lumbar spine related to back pain. 2. Slight levocurvature of the thoracolumbar spine and hypoplastic T12 ribs. Electronically signed by: Evalene Coho MD 07/23/2024 09:58 AM EST RP Workstation: [id number]     Procedures   Medications Ordered in the ED  ketorolac  (TORADOL ) 15 MG/ML injection 15 mg (15 mg Intramuscular Given 07/23/24 1132)                                    Medical Decision Making Amount and/or Complexity of Data Reviewed Labs: ordered. Radiology: ordered.  Risk Prescription drug management.   BP 111/80 (BP Location: Right Arm)   Pulse 78    Temp 98.2 F (36.8 C) (Oral)   Resp 20   Ht 5' 2 (1.575 m)   Wt 81.6 kg   LMP 07/19/2024 (Approximate)   SpO2 100%   BMI 32.92 kg/m   70:52 AM  23 year old female with history of hydrocephalus, seizures, anxiety, ADHD, migraine, brought here via EMS from college campus with complaint of back pain.  States she is on day 5 of her regular menstrual period.  As she has been having pain to her low back for the past 3 days.  States she normally does have lower back pain with her menstruation but  this time it appears to be much more intense.  While walking to her class yesterday, the pain was quite intense that she has to sit down on the ground to wait it out.  Sleeping on her dorm room mattress does not seems to help with the pain.  Today while walking pain was severe prompting this ER visit.  The pain is sharp, nonradiating, without any significant abdominal pain, dysuria, hematuria, fever, chills, lightheadedness or dizziness.  No radicular pain.  No recent injury.  She did saw the campus nurse for pain several days ago and received some ibuprofen  with minimal relief.  She was previously treated for pharyngitis and currently taking naproxen.  No new sexual partner.  Denies any bowel bladder incontinence or saddle anesthesia.  On exam patient is standing appears to be in no acute discomfort.  She does have some tenderness to her lumbar paralumbar spinal muscle but negative straight leg raise and able to ambulate without difficulty.  -Labs ordered, independently viewed and interpreted by me.  Labs remarkable for labs are reassuring -The patient was maintained on a cardiac monitor.  I personally viewed and interpreted the cardiac monitored which showed an underlying rhythm of: Normal sinus rhythm -Imaging independently viewed and interpreted by me and I agree with radiologist's interpretation.  Result remarkable for lumbar spine x-ray unremarkable -This patient presents to the ED for concern of back  pain, this involves an extensive number of treatment options, and is a complaint that carries with it a high risk of complications and morbidity.  The differential diagnosis includes muscle skeletal pain, referred pain from menstruation, cauda equina, PID, ovarian torsion -Co morbidities that complicate the patient evaluation includes seizure, anxiety, migraine -Treatment includes Toradol  -Reevaluation of the patient after these medicines showed that the patient improved -PCP office notes or outside notes reviewed -Escalation to admission/observation considered: patients feels much better, is comfortable with discharge, and will follow up with PCP -Prescription medication considered, patient comfortable with flexeril -Social Determinant of Health considered which includes stress, depression, lack of transportation      Final diagnoses:  Acute midline low back pain without sciatica    ED Discharge Orders          Ordered    cyclobenzaprine (FLEXERIL) 10 MG tablet  2 times daily PRN        07/23/24 1200               Nivia Colon, PA-C 07/23/24 1201    Cottie Donnice PARAS, MD 07/23/24 1438

## 2024-08-08 ENCOUNTER — Other Ambulatory Visit: Payer: Self-pay | Admitting: Internal Medicine

## 2024-08-08 DIAGNOSIS — G44209 Tension-type headache, unspecified, not intractable: Secondary | ICD-10-CM

## 2024-08-09 ENCOUNTER — Ambulatory Visit: Admitting: Internal Medicine

## 2024-08-29 ENCOUNTER — Encounter: Payer: Self-pay | Admitting: Internal Medicine

## 2024-08-29 DIAGNOSIS — F900 Attention-deficit hyperactivity disorder, predominantly inattentive type: Secondary | ICD-10-CM

## 2024-08-29 MED ORDER — METHYLPHENIDATE HCL ER (OSM) 27 MG PO TBCR: 27.0000 mg | EXTENDED_RELEASE_TABLET | ORAL | 0 refills | Status: DC

## 2024-08-29 NOTE — Telephone Encounter (Signed)
 Pt requesting refill for methylphenidate  27 MG PO CR tablet   LOV 07/14/24 FOV not scheduled LRF 06/15/24

## 2024-09-16 ENCOUNTER — Ambulatory Visit: Admitting: Internal Medicine

## 2024-09-16 ENCOUNTER — Encounter: Payer: Self-pay | Admitting: Internal Medicine

## 2024-09-16 VITALS — BP 115/72 | HR 92 | Temp 98.0°F | Ht 62.0 in | Wt 178.0 lb

## 2024-09-16 DIAGNOSIS — Z713 Dietary counseling and surveillance: Secondary | ICD-10-CM

## 2024-09-16 DIAGNOSIS — E669 Obesity, unspecified: Secondary | ICD-10-CM | POA: Diagnosis not present

## 2024-09-16 DIAGNOSIS — L709 Acne, unspecified: Secondary | ICD-10-CM | POA: Diagnosis not present

## 2024-09-16 DIAGNOSIS — F411 Generalized anxiety disorder: Secondary | ICD-10-CM | POA: Diagnosis not present

## 2024-09-16 DIAGNOSIS — R0982 Postnasal drip: Secondary | ICD-10-CM | POA: Diagnosis not present

## 2024-09-16 DIAGNOSIS — Z3041 Encounter for surveillance of contraceptive pills: Secondary | ICD-10-CM

## 2024-09-16 NOTE — Progress Notes (Signed)
 " [facility] [address] [place] [address] Dept: [phone] Dept Fax: [phone]  Acute Care Office Visit  Subjective:   [name] October 19, 2000 09/16/2024  Chief Complaint  Patient presents with   Follow-up    For birth control. Pt wants to discuss weight.    HPI:  Discussed the use of AI scribe software for clinical note transcription with the patient, who gave verbal consent to proceed.  History of Present Illness   Sonya Miller is a 24 year old female who presents for a follow-up on  blood pressure and weight check after starting birth control pills..  She has been using Junel for birth control, which has effectively regulated her menstrual cycles. She experiences bloating and is concerned about weight gain since starting the medication. She notes fluctuations in her eating habits, sometimes eating too little or too much, often influenced by stress, anxiety, or depression. She wants to lose weight without resorting to unhealthy eating patterns.  She has a history of anxiety disorder and depression, which she believes contribute to her irregular eating habits. She is currently seeing a counselor on campus and has discussed her eating issues with her. She is actively seeking ways to manage her weight and mental health effectively.  She has been on minocycline  for acne but has not taken it for several weeks.  She would like to discontinue this altogether.  She experiences phlegm production, particularly in the mornings and at night, which sometimes makes it difficult to breathe when lying down. She has tried Mucinex and a sore throat spray, which provide temporary relief. She describes the phlegm as white and mentions that it stains her sleeves.  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       Wt Readings from Last 3 Encounters:  09/16/24 178 lb (80.7 kg)  07/23/24 180 lb (81.6 kg)  07/14/24 181 lb 3.2  oz (82.2 kg)   BP Readings from Last 3 Encounters:  09/16/24 115/72  07/23/24 111/80  07/14/24 98/68     The following portions of the patient's history were reviewed and updated as appropriate: past medical history, past surgical history, family history, social history, allergies, medications, and problem list.   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 No pertinent family history. Current Medications[1] Allergies[2]   ROS: A complete ROS was performed with pertinent positives/negatives noted in the HPI. The remainder of the ROS are negative.    Objective:   Today's Vitals   09/16/24 0907  BP: 115/72  Pulse: 92  Temp: 98 F (36.7 C)  SpO2: 98%  Weight: 178 lb (80.7 kg)  Height: 5' 2 (1.575 m)    GENERAL: Well-appearing, in NAD. Well nourished.  SKIN: Pink, warm and dry. No rash, lesion, ulceration, or ecchymoses.  RESPIRATORY:  Chest wall symmetrical. Respirations even and non-labored. Breath sounds clear to auscultation bilaterally.  CARDIAC: S1, S2 present, regular rate and rhythm. Peripheral pulses 2+ bilaterally.  EXTREMITIES: Without clubbing, cyanosis, or edema.  NEUROLOGIC: No motor or sensory deficits. Steady, even gait.  PSYCH/MENTAL STATUS: Alert, oriented x 3. Cooperative,  appropriate mood and affect.    No results found for any visits on 09/16/24.    Assessment & Plan:  Assessment and Plan    Contraceptive management and surveillance Junel effectively regulates cycles with minor bloating. Weight slightly decreased. - Continue Junel for contraception.  Weight management and nutritional counseling Weight management affected by anxiety and depression. Weight decreased from 181 lbs to 179 lbs. Interested in structured diet plan. - Placed referral to nutrition for dietary counseling. - Continue counseling for anxiety and depression.  Generalized anxiety disorder Anxiety impacts eating habits and weight management. Receiving counseling. - Continue counseling for anxiety management.  Acne Not taking minocycline . Birth control may manage hormonal acne. Agreed to discontinue minocycline . - Monitor acne without minocycline . - Consider restarting minocycline  if acne worsens.  Postnasal drip Increased phlegm likely due to nasal drainage. Previous treatments provided temporary relief. - Recommended Flonase nasal spray. - Continue Mucinex as needed.        No orders of the defined types were placed in this encounter.  Orders Placed This Encounter  Procedures   Amb Referral to Nutrition and Diabetic Education    Referral Priority:   Routine    Referral Type:   Consultation    Referral Reason:   Specialty Services Required    Number of Visits Requested:   1   Lab Orders  No laboratory test(s) ordered today   No images are attached to the encounter or orders placed in the encounter.  Return in about 3 months (around 12/15/2024) for Adhd, anxiety, acne .   Rosina Senters, FNP     [1]  Current Outpatient Medications:    busPIRone  (BUSPAR ) 10 MG tablet, Take 1 tablet (10 mg total) by mouth 2 (two) times daily as needed (anxiety or panic attacks)., Disp: 180 tablet, Rfl: 1   cyclobenzaprine  (FLEXERIL ) 10 MG tablet, Take 1 tablet (10 mg total) by mouth  2 (two) times daily as needed for muscle spasms., Disp: 20 tablet, Rfl: 0   levETIRAcetam  (KEPPRA ) 1000 MG tablet, Take 1,000 mg by mouth 2 (two) times daily., Disp: , Rfl:    LORazepam (ATIVAN) 1 MG tablet, Take 1 mg by mouth. (Patient taking differently: Take 1 mg by mouth as needed.), Disp: , Rfl:    methylphenidate  27 MG PO CR tablet, Take 1 tablet (27 mg total) by mouth daily., Disp: 30 tablet, Rfl: 0   methylphenidate  27 MG PO CR tablet, Take 1 tablet (27 mg total) by mouth every morning., Disp: 30 tablet, Rfl: 0   methylphenidate  27 MG PO CR tablet, Take 1 tablet (27 mg total) by mouth every morning., Disp: 30 tablet, Rfl: 0   Midazolam 5 MG/0.1ML SOLN, Give 1 spray (5mg ) into 1 nostril. If no response in 10 min, may give second spray in other nostril. Do not give second dose if patient has trouble breathing or excessive sedation. Max 2 doses/seizure episode, 1 episode every 3 days or 5 episodes/month., Disp: , Rfl:    norethindrone -ethinyl estradiol (JUNEL 1/20) 1-20 MG-MCG tablet, Take 1 tablet by mouth daily., Disp: 84 tablet, Rfl: 3 [2] No Known Allergies  "

## 2024-09-16 NOTE — Addendum Note (Signed)
 Addended by: Travelle Mcclimans on: 09/16/2024 12:14 PM   Modules accepted: Orders

## 2024-09-26 ENCOUNTER — Encounter: Payer: Self-pay | Admitting: Internal Medicine

## 2024-09-26 DIAGNOSIS — F900 Attention-deficit hyperactivity disorder, predominantly inattentive type: Secondary | ICD-10-CM

## 2024-09-26 DIAGNOSIS — Z713 Dietary counseling and surveillance: Secondary | ICD-10-CM

## 2024-09-28 MED ORDER — METHYLPHENIDATE HCL ER (OSM) 27 MG PO TBCR: 27.0000 mg | EXTENDED_RELEASE_TABLET | ORAL | 0 refills | Status: AC

## 2024-12-05 ENCOUNTER — Ambulatory Visit: Admitting: Internal Medicine

## 2024-12-19 ENCOUNTER — Ambulatory Visit: Admitting: Internal Medicine
